# Patient Record
Sex: Male | Born: 2000
Health system: Southern US, Community
[De-identification: ages and names within clinical notes are randomized; demographics above are authoritative.]

## PROBLEM LIST (undated history)

## (undated) DIAGNOSIS — G43909 Migraine, unspecified, not intractable, without status migrainosus: Secondary | ICD-10-CM

---

## 2019-06-24 ENCOUNTER — Other Ambulatory Visit: Payer: Self-pay | Admitting: *Deleted

## 2019-06-24 DIAGNOSIS — Z20822 Contact with and (suspected) exposure to covid-19: Secondary | ICD-10-CM

## 2019-06-29 LAB — NOVEL CORONAVIRUS, NAA: SARS-CoV-2, NAA: NOT DETECTED

## 2020-06-11 ENCOUNTER — Emergency Department
Admission: EM | Admit: 2020-06-11 | Discharge: 2020-06-11 | Disposition: A | Discharge status: Home / Self Care | Payer: Medicaid Other | Attending: Emergency Medicine | Admitting: Emergency Medicine

## 2020-06-11 ENCOUNTER — Other Ambulatory Visit: Payer: Self-pay

## 2020-06-11 ENCOUNTER — Emergency Department: Payer: Medicaid Other

## 2020-06-11 DIAGNOSIS — B349 Viral infection, unspecified: Secondary | ICD-10-CM | POA: Insufficient documentation

## 2020-06-11 DIAGNOSIS — R109 Unspecified abdominal pain: Secondary | ICD-10-CM | POA: Diagnosis present

## 2020-06-11 DIAGNOSIS — K529 Noninfective gastroenteritis and colitis, unspecified: Secondary | ICD-10-CM | POA: Diagnosis not present

## 2020-06-11 LAB — COMPREHENSIVE METABOLIC PANEL
ALT: 39 U/L (ref 0–44)
AST: 87 U/L — ABNORMAL HIGH (ref 15–41)
Albumin: 4 g/dL (ref 3.5–5.0)
Alkaline Phosphatase: 26 U/L — ABNORMAL LOW (ref 38–126)
Anion gap: 11 (ref 5–15)
BUN: 25 mg/dL — ABNORMAL HIGH (ref 6–20)
CO2: 24 mmol/L (ref 22–32)
Calcium: 8.5 mg/dL — ABNORMAL LOW (ref 8.9–10.3)
Chloride: 99 mmol/L (ref 98–111)
Creatinine, Ser: 1.17 mg/dL (ref 0.61–1.24)
GFR calc Af Amer: >60 mL/min (ref >60)
GFR calc non Af Amer: >60 mL/min (ref >60)
Glucose, Bld: 130 mg/dL — ABNORMAL HIGH (ref 70–99)
Potassium: 4 mmol/L (ref 3.5–5.1)
Sodium: 134 mmol/L — ABNORMAL LOW (ref 135–145)
Total Bilirubin: 0.8 mg/dL (ref 0.3–1.2)
Total Protein: 6.7 g/dL (ref 6.5–8.1)

## 2020-06-11 LAB — CBC WITH DIFFERENTIAL/PLATELET
Abs Immature Granulocytes: 0.01 10*3/uL (ref 0.00–0.07)
Basophils Absolute: 0 10*3/uL (ref 0.0–0.1)
Basophils Relative: 0 %
Eosinophils Absolute: 0 10*3/uL (ref 0.0–0.5)
Eosinophils Relative: 0 %
HCT: 47.6 % (ref 39.0–52.0)
Hemoglobin: 16.6 g/dL (ref 13.0–17.0)
Immature Granulocytes: 0 %
Lymphocytes Relative: 39 %
Lymphs Abs: 1.2 10*3/uL (ref 0.7–4.0)
MCH: 30.8 pg (ref 26.0–34.0)
MCHC: 34.9 g/dL (ref 30.0–36.0)
MCV: 88.3 fL (ref 80.0–100.0)
Monocytes Absolute: 0.1 10*3/uL (ref 0.1–1.0)
Monocytes Relative: 4 %
Neutro Abs: 1.7 10*3/uL (ref 1.7–7.7)
Neutrophils Relative %: 57 %
Platelets: 64 10*3/uL — ABNORMAL LOW (ref 150–400)
RBC: 5.39 MIL/uL (ref 4.22–5.81)
RDW: 12.5 % (ref 11.5–15.5)
WBC: 3 10*3/uL — ABNORMAL LOW (ref 4.0–10.5)
nRBC: 0 % (ref 0.0–0.2)

## 2020-06-11 LAB — CBC
HCT: 47.8 % (ref 39.0–52.0)
Hemoglobin: 16.6 g/dL (ref 13.0–17.0)
MCH: 30.7 pg (ref 26.0–34.0)
MCHC: 34.7 g/dL (ref 30.0–36.0)
MCV: 88.4 fL (ref 80.0–100.0)
Platelets: 65 10*3/uL — ABNORMAL LOW (ref 150–400)
RBC: 5.41 MIL/uL (ref 4.22–5.81)
RDW: 12.4 % (ref 11.5–15.5)
WBC: 3 10*3/uL — ABNORMAL LOW (ref 4.0–10.5)
nRBC: 0 % (ref 0.0–0.2)

## 2020-06-11 LAB — LIPASE, BLOOD: Lipase: 252 U/L — ABNORMAL HIGH (ref 11–51)

## 2020-06-11 MED ORDER — IOHEXOL 300 MG/ML  SOLN
75.0000 mL | Freq: Once | INTRAMUSCULAR | Status: AC | PRN
  Administered 2020-06-11: 75 mL via INTRAVENOUS

## 2020-06-11 MED ORDER — ONDANSETRON HCL 4 MG/2ML IJ SOLN
4.0000 mg | Freq: Once | INTRAMUSCULAR | Status: AC
  Administered 2020-06-11: 4 mg via INTRAVENOUS
  Filled 2020-06-11: qty 2

## 2020-06-11 MED ORDER — SODIUM CHLORIDE 0.9% FLUSH: 3.0000 mL | Freq: Once | INTRAVENOUS | Status: DC

## 2020-06-11 MED ORDER — FAMOTIDINE 20 MG PO TABS
20.0000 mg | ORAL_TABLET | Freq: Two times a day (BID) | ORAL | 0 refills | Status: DC
Start: 2020-06-11 — End: 2020-06-21

## 2020-06-11 MED ORDER — ALUM & MAG HYDROXIDE-SIMETH 200-200-20 MG/5ML PO SUSP
30.0000 mL | Freq: Once | ORAL | Status: AC
  Administered 2020-06-11: 30 mL via ORAL
  Filled 2020-06-11: qty 30

## 2020-06-11 MED ORDER — FAMOTIDINE 20 MG PO TABS
40.0000 mg | ORAL_TABLET | Freq: Once | ORAL | Status: AC
  Administered 2020-06-11: 40 mg via ORAL
  Filled 2020-06-11: qty 2

## 2020-06-11 MED ORDER — SODIUM CHLORIDE 0.9 % IV BOLUS
1000.0000 mL | Freq: Once | INTRAVENOUS | Status: AC
  Administered 2020-06-11: 1000 mL via INTRAVENOUS

## 2020-06-11 MED ORDER — KETOROLAC TROMETHAMINE 30 MG/ML IJ SOLN
15.0000 mg | INTRAMUSCULAR | Status: AC
  Administered 2020-06-11: 15 mg via INTRAVENOUS
  Filled 2020-06-11: qty 1

## 2020-06-11 MED ORDER — ONDANSETRON 4 MG PO TBDP
4.0000 mg | ORAL_TABLET | Freq: Three times a day (TID) | ORAL | 0 refills | Status: DC | PRN
Start: 2020-06-11 — End: 2020-06-21

## 2020-06-11 MED ORDER — ALUMINUM-MAGNESIUM-SIMETHICONE 200-200-20 MG/5ML PO SUSP
30.0000 mL | Freq: Three times a day (TID) | ORAL | 0 refills | Status: DC
Start: 2020-06-11 — End: 2020-06-21

## 2020-06-11 NOTE — ED Notes (Signed)
No report received- RN in cath lab. Assumed care at this time.

## 2020-06-11 NOTE — ED Notes (Signed)
Warm compress applied to left AC. 

## 2020-06-11 NOTE — ED Notes (Signed)
Pt passing PO/fluid challenge.

## 2020-06-11 NOTE — ED Notes (Signed)
attempted IV x2 without success. Alternate RN to try.

## 2020-06-11 NOTE — ED Provider Notes (Signed)
[facility] Emergency Department Provider Note  ____________________________________________  Time seen: Approximately 1:06 PM  I have reviewed the triage vital signs and the nursing notes.   HISTORY  Chief Complaint Abdominal Pain    HPI Juan Vaughn is a 19 y.o. male with no significant past medical history who comes the ED complaining of nausea vomiting diarrhea and generalized abdominal pain worse in the right lower quadrant that started 2 days ago, constant, waxing waning, no aggravating or alleviating factors.  Had a fever yesterday but none today.  No preceding illness.  No known Covid exposures or other sick contacts, no unprotected sex.  Had both Covid vaccines 2 months ago.  Reports a recent negative Covid test.      History reviewed. No pertinent past medical history.   There are no problems to display for this patient.    History reviewed. No pertinent surgical history.   Prior to Admission medications   Medication Sig Start Date End Date Taking? Authorizing Provider  aluminum-magnesium hydroxide-simethicone (MAALOX) 115-726-20 MG/5ML SUSP Take 30 mLs by mouth 4 (four) times daily -  before meals and at bedtime. 06/11/20   Carrie Mew, MD  famotidine (PEPCID) 20 MG tablet Take 1 tablet (20 mg total) by mouth 2 (two) times daily. 06/11/20   Carrie Mew, MD  ondansetron (ZOFRAN ODT) 4 MG disintegrating tablet Take 1 tablet (4 mg total) by mouth every 8 (eight) hours as needed for nausea or vomiting. 06/11/20   Carrie Mew, MD  None   Allergies Patient has no allergy information on record.   No family history on file.  Social History Social History   Tobacco Use  . Smoking status: Never Smoker  . Smokeless tobacco: Never Used  Substance Use Topics  . Alcohol use: Never  . Drug use: Never    Review of Systems  Constitutional:   Positive fever and chills.  ENT:   Positive sore throat. No  rhinorrhea. Cardiovascular:   No chest pain or syncope. Respiratory:   No dyspnea or cough. Gastrointestinal:   Positive as above for abdominal pain, vomiting and diarrhea.  Musculoskeletal:   Negative for focal pain or swelling All other systems reviewed and are negative except as documented above in ROS and HPI.  ____________________________________________   PHYSICAL EXAM:  VITAL SIGNS: ED Triage Vitals [06/11/20 0853]  Enc Vitals Group     BP 101/70     Pulse Rate (!) 112     Resp 19     Temp 99.6 F (37.6 C)     Temp src      SpO2 100 %     Weight 125 lb (56.7 kg)     Height 5\' 6"  (1.676 m)     Head Circumference      Peak Flow      Pain Score 4     Pain Loc      Pain Edu?      Excl. in [place]?     Vital signs reviewed, nursing assessments reviewed.   Constitutional:   Alert and oriented. Non-toxic appearance. Eyes:   Conjunctivae are normal. EOMI. PERRL. ENT      Head:   Normocephalic and atraumatic.      Nose: Normal      Mouth/Throat: Moist mucosa, no pharyngeal erythema      Neck:   No meningismus. Full ROM. Hematological/Lymphatic/Immunilogical:   No cervical lymphadenopathy. Cardiovascular:   Tachycardia heart rate 110. Symmetric bilateral radial and DP pulses.  No  murmurs. Cap refill less than 2 seconds. Respiratory:   Normal respiratory effort without tachypnea/retractions. Breath sounds are clear and equal bilaterally. No wheezes/rales/rhonchi. Gastrointestinal:   Soft with right lower quadrant tenderness.  No epigastric tenderness. Non distended. There is no CVA tenderness.  No rebound, rigidity, or guarding. Musculoskeletal:   Normal range of motion in all extremities. No joint effusions.  No lower extremity tenderness.  No edema. Neurologic:   Normal speech and language.  Motor grossly intact. No acute focal neurologic deficits are appreciated.  Skin:    Skin is warm, dry and intact. No rash noted.  No petechiae, purpura, or  bullae.  ____________________________________________    LABS (pertinent positives/negatives) (all labs ordered are listed, but only abnormal results are displayed) Labs Reviewed  LIPASE, BLOOD - Abnormal; Notable for the following components:      Result Value   Lipase 252 (*)    All other components within normal limits  COMPREHENSIVE METABOLIC PANEL - Abnormal; Notable for the following components:   Sodium 134 (*)    Glucose, Bld 130 (*)    BUN 25 (*)    Calcium 8.5 (*)    AST 87 (*)    Alkaline Phosphatase 26 (*)    All other components within normal limits  CBC - Abnormal; Notable for the following components:   WBC 3.0 (*)    Platelets 65 (*)    All other components within normal limits  CBC WITH DIFFERENTIAL/PLATELET - Abnormal; Notable for the following components:   WBC 3.0 (*)    Platelets 64 (*)    All other components within normal limits  URINALYSIS, COMPLETE (UACMP) WITH MICROSCOPIC   ____________________________________________   EKG    ____________________________________________    RADIOLOGY  CT ABDOMEN PELVIS W CONTRAST  Result Date: 06/11/2020 CLINICAL DATA:  Pt comes via POV from home with c/o N/V/D, abdominal pain. Pt states he hasn't been able to tolerate anything PO. Pt states RLQ pain. EXAM: CT ABDOMEN AND PELVIS WITH CONTRAST TECHNIQUE: Multidetector CT imaging of the abdomen and pelvis was performed using the standard protocol following bolus administration of intravenous contrast. CONTRAST:  44mL OMNIPAQUE IOHEXOL 300 MG/ML  SOLN COMPARISON:  None. FINDINGS: Lower chest: Clear lung bases.  Heart normal in size. Hepatobiliary: No focal liver abnormality is seen. No gallstones, gallbladder wall thickening, or biliary dilatation. Pancreas: Unremarkable. No pancreatic ductal dilatation or surrounding inflammatory changes. Spleen: Normal in size without focal abnormality. Adrenals/Urinary Tract: Adrenal glands are unremarkable. Kidneys are normal,  without renal calculi, focal lesion, or hydronephrosis. Bladder is unremarkable. Stomach/Bowel: On the coronal and sagittal views, the normal appendix is suggested. There are no findings to suggest acute appendicitis. Normal stomach. Small bowel and colon are normal in caliber. No wall thickening. No bowel inflammation. Vascular/Lymphatic: No significant vascular findings are present. No enlarged abdominal or pelvic lymph nodes. Reproductive: Uterus and bilateral adnexa are unremarkable. Other: No abdominal wall hernia or abnormality. No abdominopelvic ascites. Musculoskeletal: No acute or significant osseous findings. IMPRESSION: Normal enhanced CT scan of the abdomen pelvis. Electronically Signed   By: Amie Portland M.D.   On: 06/11/2020 12:17    ____________________________________________   PROCEDURES Procedures  ____________________________________________  DIFFERENTIAL DIAGNOSIS   Appendicitis, pancreatitis, viral gastroenteritis, dehydration, electrolyte normality  CLINICAL IMPRESSION / ASSESSMENT AND PLAN / ED COURSE  Medications ordered in the ED: Medications  sodium chloride flush (NS) 0.9 % injection 3 mL (3 mLs Intravenous Not Given 06/11/20 1104)  sodium chloride 0.9 % bolus 1,000  mL (0 mLs Intravenous Stopped 06/11/20 1333)  ondansetron (ZOFRAN) injection 4 mg (4 mg Intravenous Given 06/11/20 1125)  ketorolac (TORADOL) 30 MG/ML injection 15 mg (15 mg Intravenous Given 06/11/20 1124)  iohexol (OMNIPAQUE) 300 MG/ML solution 75 mL (75 mLs Intravenous Contrast Given 06/11/20 1204)  alum & mag hydroxide-simeth (MAALOX/MYLANTA) 200-200-20 MG/5ML suspension 30 mL (30 mLs Oral Given 06/11/20 1246)  famotidine (PEPCID) tablet 40 mg (40 mg Oral Given 06/11/20 1246)    Pertinent labs & imaging results that were available during my care of the patient were reviewed by me and considered in my medical decision making (see chart for details).  Nyree Yonker was evaluated in Emergency Department  on 06/11/2020 for the symptoms described in the history of present illness. He was evaluated in the context of the global COVID-19 pandemic, which necessitated consideration that the patient might be at risk for infection with the SARS-CoV-2 virus that causes COVID-19. Institutional protocols and algorithms that pertain to the evaluation of patients at risk for COVID-19 are in a state of rapid change based on information released by regulatory bodies including the CDC and federal and state organizations. These policies and algorithms were followed during the patient's care in the ED.   Patient presents with multiple symptoms suggestive of viral illness.  Does have right lower quadrant tenderness on exam so we will proceed with CT scan to evaluate for appendicitis.  IV fluids, Toradol, Zofran for pain and nausea relief and hydration.  Clinical Course as of Jun 11 1436  Sun Jun 11, 2020  1239 Patient feeling better.  Abdomen is nontender.  Will p.o. trial, plan to discharge home if tolerating oral intake with supportive care for viral syndrome.   [PS]    Clinical Course User Index [PS] Sharman Cheek, MD      ----------------------------------------- 2:37 PM on 06/11/2020 -----------------------------------------  Tolerating oral intake and feeling better, vital signs are normal.  Stable for discharge home. ____________________________________________   FINAL CLINICAL IMPRESSION(S) / ED DIAGNOSES    Final diagnoses:  Gastroenteritis  Viral syndrome     ED Discharge Orders         Ordered    famotidine (PEPCID) 20 MG tablet  2 times daily     Discontinue  Reprint     06/11/20 1435    ondansetron (ZOFRAN ODT) 4 MG disintegrating tablet  Every 8 hours PRN     Discontinue  Reprint     06/11/20 1435    aluminum-magnesium hydroxide-simethicone (MAALOX) 200-200-20 MG/5ML SUSP  3 times daily before meals & bedtime     Discontinue  Reprint     06/11/20 1435          Portions of  this note were generated with dragon dictation software. Dictation errors may occur despite best attempts at proofreading.   Sharman Cheek, MD 06/11/20 [phone]

## 2020-06-11 NOTE — ED Triage Notes (Addendum)
Pt comes via POV from home with c/o N/V/D, abdominal pain. Pt states he hasn't been able to tolerate anything PO. Pt states RLQ pain.   Pt states he was recently checked for COVID and it was negative.

## 2020-06-11 NOTE — ED Notes (Signed)
Pt still needs urine sample. Pt unable to pee at this time. Pt provided with urine cup

## 2020-06-11 NOTE — ED Notes (Signed)
Pt provided crackers and water for PO challenge

## 2020-06-11 NOTE — ED Notes (Signed)
Pt c/o of abdominal pain, nausea, and dehydration due to being unable to tolerate food or drink. Pt is AOx4, appears uncomfortable.

## 2020-06-11 NOTE — ED Notes (Signed)
Pt in CT.

## 2020-06-17 ENCOUNTER — Inpatient Hospital Stay
Admission: EM | Admit: 2020-06-17 | Discharge: 2020-06-21 | DRG: 871 | Disposition: A | Discharge status: Home / Self Care | Payer: Medicaid Other | Attending: Family Medicine | Admitting: Family Medicine

## 2020-06-17 ENCOUNTER — Other Ambulatory Visit: Payer: Self-pay

## 2020-06-17 ENCOUNTER — Emergency Department: Payer: Medicaid Other

## 2020-06-17 ENCOUNTER — Encounter: Payer: Self-pay | Admitting: Emergency Medicine

## 2020-06-17 DIAGNOSIS — G030 Nonpyogenic meningitis: Secondary | ICD-10-CM

## 2020-06-17 DIAGNOSIS — J189 Pneumonia, unspecified organism: Secondary | ICD-10-CM | POA: Diagnosis present

## 2020-06-17 DIAGNOSIS — Z20822 Contact with and (suspected) exposure to covid-19: Secondary | ICD-10-CM | POA: Diagnosis present

## 2020-06-17 DIAGNOSIS — A419 Sepsis, unspecified organism: Principal | ICD-10-CM | POA: Diagnosis present

## 2020-06-17 DIAGNOSIS — E43 Unspecified severe protein-calorie malnutrition: Secondary | ICD-10-CM | POA: Diagnosis present

## 2020-06-17 DIAGNOSIS — B20 Human immunodeficiency virus [HIV] disease: Secondary | ICD-10-CM

## 2020-06-17 DIAGNOSIS — Z681 Body mass index (BMI) 19 or less, adult: Secondary | ICD-10-CM

## 2020-06-17 DIAGNOSIS — Z21 Asymptomatic human immunodeficiency virus [HIV] infection status: Secondary | ICD-10-CM | POA: Diagnosis present

## 2020-06-17 DIAGNOSIS — G43909 Migraine, unspecified, not intractable, without status migrainosus: Secondary | ICD-10-CM | POA: Diagnosis present

## 2020-06-17 DIAGNOSIS — B159 Hepatitis A without hepatic coma: Secondary | ICD-10-CM | POA: Diagnosis present

## 2020-06-17 DIAGNOSIS — A879 Viral meningitis, unspecified: Secondary | ICD-10-CM | POA: Diagnosis present

## 2020-06-17 HISTORY — DX: Migraine, unspecified, not intractable, without status migrainosus: G43.909

## 2020-06-17 LAB — BASIC METABOLIC PANEL
Anion gap: 12 (ref 5–15)
BUN: 17 mg/dL (ref 6–20)
CO2: 25 mmol/L (ref 22–32)
Calcium: 8.9 mg/dL (ref 8.9–10.3)
Chloride: 100 mmol/L (ref 98–111)
Creatinine, Ser: 1.21 mg/dL (ref 0.61–1.24)
GFR calc Af Amer: >60 mL/min (ref >60)
GFR calc non Af Amer: >60 mL/min (ref >60)
Glucose, Bld: 100 mg/dL — ABNORMAL HIGH (ref 70–99)
Potassium: 4.3 mmol/L (ref 3.5–5.1)
Sodium: 137 mmol/L (ref 135–145)

## 2020-06-17 LAB — CBC
HCT: 44.1 % (ref 39.0–52.0)
Hemoglobin: 15.2 g/dL (ref 13.0–17.0)
MCH: 30.5 pg (ref 26.0–34.0)
MCHC: 34.5 g/dL (ref 30.0–36.0)
MCV: 88.4 fL (ref 80.0–100.0)
Platelets: 286 10*3/uL (ref 150–400)
RBC: 4.99 MIL/uL (ref 4.22–5.81)
RDW: 11.9 % (ref 11.5–15.5)
WBC: 6.2 10*3/uL (ref 4.0–10.5)
nRBC: 0 % (ref 0.0–0.2)

## 2020-06-17 LAB — RAPID HIV SCREEN (HIV 1/2 AB+AG)
HIV 1/2 Antibodies: NONREACTIVE
HIV-1 P24 Antigen - HIV24: REACTIVE — AB

## 2020-06-17 LAB — URINALYSIS, COMPLETE (UACMP) WITH MICROSCOPIC
Bacteria, UA: NONE SEEN
Bilirubin Urine: NEGATIVE
Glucose, UA: NEGATIVE mg/dL
Hgb urine dipstick: NEGATIVE
Ketones, ur: 20 mg/dL — AB
Nitrite: NEGATIVE
Protein, ur: 30 mg/dL — AB
Specific Gravity, Urine: 1.031 — ABNORMAL HIGH (ref 1.005–1.030)
Squamous Epithelial / HPF: NONE SEEN (ref 0–5)
pH: 7 (ref 5.0–8.0)

## 2020-06-17 LAB — GLUCOSE, CAPILLARY: Glucose-Capillary: 90 mg/dL (ref 70–99)

## 2020-06-17 LAB — GROUP A STREP BY PCR: Group A Strep by PCR: NOT DETECTED

## 2020-06-17 LAB — SARS CORONAVIRUS 2 BY RT PCR (HOSPITAL ORDER, PERFORMED IN ~~LOC~~ HOSPITAL LAB): SARS Coronavirus 2: NEGATIVE

## 2020-06-17 LAB — LACTIC ACID, PLASMA: Lactic Acid, Venous: 2.1 mmol/L (ref 0.5–1.9)

## 2020-06-17 MED ORDER — ACETAMINOPHEN 500 MG PO TABS
ORAL_TABLET | ORAL | Status: AC
  Administered 2020-06-17: 1000 mg
  Filled 2020-06-17: qty 2

## 2020-06-17 MED ORDER — METOCLOPRAMIDE HCL 10 MG PO TABS
10.0000 mg | ORAL_TABLET | Freq: Once | ORAL | Status: AC
  Administered 2020-06-17: 10 mg via ORAL
  Filled 2020-06-17: qty 1

## 2020-06-17 MED ORDER — KETOROLAC TROMETHAMINE 30 MG/ML IJ SOLN
30.0000 mg | Freq: Once | INTRAMUSCULAR | Status: AC
  Administered 2020-06-17: 30 mg via INTRAMUSCULAR
  Filled 2020-06-17: qty 1

## 2020-06-17 MED ORDER — DIPHENHYDRAMINE HCL 50 MG/ML IJ SOLN
25.0000 mg | Freq: Once | INTRAMUSCULAR | Status: AC
  Administered 2020-06-17: 25 mg via INTRAVENOUS
  Filled 2020-06-17: qty 1

## 2020-06-17 MED ORDER — SODIUM CHLORIDE 0.9 % IV BOLUS
1000.0000 mL | Freq: Once | INTRAVENOUS | Status: AC
  Administered 2020-06-17: 1000 mL via INTRAVENOUS

## 2020-06-17 NOTE — ED Notes (Signed)
Patient given a warm blanket and given update on wait time. Patient verbalizes understanding.

## 2020-06-17 NOTE — ED Notes (Signed)
Patient given a warm blanket. 

## 2020-06-17 NOTE — ED Notes (Signed)
Patient c/o mild photosensitivity and has chills.

## 2020-06-17 NOTE — ED Notes (Signed)
Pt requesting HIV testing at this time. Pt denies any new known exposure but reports "just wanting to make sure". PA Annice Pih, notified

## 2020-06-17 NOTE — ED Notes (Addendum)
PA Annice Pih notified of lab results-   Lab tech informed this RN of reactive HIV test and a lactic result of 2.1.  No new orders at this time

## 2020-06-17 NOTE — ED Provider Notes (Signed)
Emergency Department Provider Note  ____________________________________________  Time seen: Approximately 9:52 PM  I have reviewed the triage vital signs and the nursing notes.   HISTORY  Chief Complaint Migraine and Nausea   Historian Patient     HPI Juan Vaughn is a 19 y.o. male with a history of migraines presents to the emergency department with fever, headache, chills and malaise for the past 2 to 3 days.  Patient states that he has a history of migraines and his current headache feels similar. He denies pain at the neck and has full range of motion.  He denies pharyngitis.  He states that he has had some nausea since being diagnosed with gastroenteritis on 06/11/2020.Marland Kitchen  No chest pain, chest tightness or abdominal pain.  Patient states that he occasionally feels dizzy when he stands up.  He states that he would like to be tested for HIV when questioned about whether he has had recent unprotected sex.  He denies cough or recent travel.  No recent weight loss or weight gain.  No night sweats or chills.   Past Medical History:  Diagnosis Date  . Migraine      Immunizations up to date:  Yes.     Past Medical History:  Diagnosis Date  . Migraine     There are no problems to display for this patient.   History reviewed. No pertinent surgical history.  Prior to Admission medications   Medication Sig Start Date End Date Taking? Authorizing Provider  aluminum-magnesium hydroxide-simethicone (MAALOX) 200-200-20 MG/5ML SUSP Take 30 mLs by mouth 4 (four) times daily -  before meals and at bedtime. 06/11/20   Sharman Cheek, MD  famotidine (PEPCID) 20 MG tablet Take 1 tablet (20 mg total) by mouth 2 (two) times daily. 06/11/20   Sharman Cheek, MD  ondansetron (ZOFRAN ODT) 4 MG disintegrating tablet Take 1 tablet (4 mg total) by mouth every 8 (eight) hours as needed for nausea or vomiting. 06/11/20   Sharman Cheek, MD    Allergies Patient has no known  allergies.  No family history on file.  Social History Social History   Tobacco Use  . Smoking status: Never Smoker  . Smokeless tobacco: Never Used  Vaping Use  . Vaping Use: Never used  Substance Use Topics  . Alcohol use: Never  . Drug use: Never     Review of Systems  Constitutional: Patient has fever.  Eyes:  No discharge ENT: No upper respiratory complaints. Respiratory: no cough. No SOB/ use of accessory muscles to breath Gastrointestinal:   No nausea, no vomiting.  No diarrhea.  No constipation. Musculoskeletal: Negative for musculoskeletal pain. Neuro: Patient has headache.  Skin: Negative for rash, abrasions, lacerations, ecchymosis.    ____________________________________________   PHYSICAL EXAM:  VITAL SIGNS: ED Triage Vitals  Enc Vitals Group     BP 06/17/20 1913 100/63     Pulse Rate 06/17/20 1913 (!) 114     Resp 06/17/20 1913 18     Temp 06/17/20 1913 98.9 F (37.2 C)     Temp Source 06/17/20 1913 Oral     SpO2 06/17/20 1913 100 %     Weight 06/17/20 1913 107 lb (48.5 kg)     Height 06/17/20 1913 5\' 6"  (1.676 m)     Head Circumference --      Peak Flow --      Pain Score 06/17/20 1912 10     Pain Loc --      Pain Edu? --  Excl. in [place]? --      Constitutional: Alert and oriented. Patient is lying supine. Eyes: Conjunctivae are normal. PERRL. EOMI. Head: Atraumatic. ENT:      Ears: Tympanic membranes are mildly injected with mild effusion bilaterally.       Nose: No congestion/rhinnorhea.      Mouth/Throat: Mucous membranes are moist. Posterior pharynx is mildly erythematous.  Hematological/Lymphatic/Immunilogical: No cervical lymphadenopathy.  Cardiovascular: Normal rate, regular rhythm. Normal S1 and S2.  Good peripheral circulation. Respiratory: Normal respiratory effort without tachypnea or retractions. Lungs CTAB. Good air entry to the bases with no decreased or absent breath sounds. Gastrointestinal: Bowel sounds 4 quadrants.  Soft and nontender to palpation. No guarding or rigidity. No palpable masses. No distention. No CVA tenderness. Musculoskeletal: Full range of motion to all extremities. No gross deformities appreciated. Neurologic:  Normal speech and language. No gross focal neurologic deficits are appreciated.  Skin:  Skin is warm, dry and intact. No rash noted. Psychiatric: Mood and affect are normal. Speech and behavior are normal. Patient exhibits appropriate insight and judgement.   ____________________________________________   LABS (all labs ordered are listed, but only abnormal results are displayed)  Labs Reviewed  BASIC METABOLIC PANEL - Abnormal; Notable for the following components:      Result Value   Glucose, Bld 100 (*)    All other components within normal limits  URINALYSIS, COMPLETE (UACMP) WITH MICROSCOPIC - Abnormal; Notable for the following components:   Color, Urine YELLOW (*)    APPearance HAZY (*)    Specific Gravity, Urine 1.031 (*)    Ketones, ur 20 (*)    Protein, ur 30 (*)    Leukocytes,Ua TRACE (*)    All other components within normal limits  LACTIC ACID, PLASMA - Abnormal; Notable for the following components:   Lactic Acid, Venous 2.1 (*)    All other components within normal limits  RAPID HIV SCREEN (HIV 1/2 AB+AG) - Abnormal; Notable for the following components:   HIV-1 P24 Antigen - HIV24 Reactive (*)    All other components within normal limits  GROUP A STREP BY PCR  SARS CORONAVIRUS 2 BY RT PCR (HOSPITAL ORDER, PERFORMED IN [facility])  URINE CULTURE  CULTURE, BLOOD (ROUTINE X 2)  CULTURE, BLOOD (ROUTINE X 2)  URINE CULTURE  CBC  GLUCOSE, CAPILLARY  LACTIC ACID, PLASMA  HIV-1/2 AB - DIFFERENTIATION  CBG MONITORING, ED   ____________________________________________  EKG   ____________________________________________  RADIOLOGY Geraldo Pitter, personally viewed and evaluated these images (plain radiographs) as part of my medical  decision making, as well as reviewing the written report by the radiologist.    DG Chest 1 View  Result Date: 06/17/2020 CLINICAL DATA:  19 year old male with fever. EXAM: CHEST  1 VIEW COMPARISON:  None. FINDINGS: The heart size and mediastinal contours are within normal limits. Both lungs are clear. The visualized skeletal structures are unremarkable. IMPRESSION: No active disease. Electronically Signed   By: Elgie Collard M.D.   On: 06/17/2020 22:13    ____________________________________________    PROCEDURES  Procedure(s) performed:     Procedures     Medications  ketorolac (TORADOL) 30 MG/ML injection 30 mg (30 mg Intramuscular Given 06/17/20 2156)  metoCLOPramide (REGLAN) tablet 10 mg (10 mg Oral Given 06/17/20 2156)  diphenhydrAMINE (BENADRYL) injection 25 mg (25 mg Intravenous Given 06/17/20 2156)  sodium chloride 0.9 % bolus 1,000 mL (1,000 mLs Intravenous New Bag/Given 06/17/20 2234)  acetaminophen (TYLENOL) 500 MG tablet (  1,000 mg  Given 06/17/20 2234)     ____________________________________________   INITIAL IMPRESSION / ASSESSMENT AND PLAN / ED COURSE  Pertinent labs & imaging results that were available during my care of the patient were reviewed by me and considered in my medical decision making (see chart for details).      Assessment and plan:  Sepsis 19 year old male presents to the emergency department with fever, headache and dizziness for the past 2 to 3 days.  Patient was tachycardic at triage but was initially afebrile.  Patient was transitioned to flex for migraine management as patient stated that he had a history of migraines in triage.  Nursing staff noted that patient seemed chilled and reassessed vital signs.  Patient was febrile, tachycardic and hypotensive upon reassessment.  During history, I questioned patient if he had had recent unprotected sex.  He stated that he had not but did request a HIV test.  HIV 1P 24 was reactive.  I added on  lactic acid and blood cultures due to concern for sepsis.  Lactic acid was elevated.  Blood cultures are in process at this time.  Group A strep testing was negative.  I did note that patient's white blood cell count had increased from 3 to 6.2 over the past 6 days which is concerning for leukocytosis in the setting of HIV.  I had an extensive conversation with my attending, Dr. Roxan Hockey regarding patient's history and presenting symptoms in the emergency department.  Patient was transitioned to the main side of the emergency department for further care and management. CT head without contrast was ordered per Dr. Danella Penton recommendation. I will hold off on administering IV antibiotics until a decision can be made regarding whether patient will need a lumbar puncture.   ____________________________________________  FINAL CLINICAL IMPRESSION(S) / ED DIAGNOSES  Final diagnoses:  Symptomatic HIV infection (HCC)  Sepsis, due to unspecified organism, unspecified whether acute organ dysfunction present (HCC)      NEW MEDICATIONS STARTED DURING THIS VISIT:  ED Discharge Orders    None          This chart was dictated using voice recognition software/Dragon. Despite best efforts to proofread, errors can occur which can change the meaning. Any change was purely unintentional.     Orvil Feil, PA-C 06/18/20 0007    Willy Eddy, MD 06/18/20 [phone]

## 2020-06-17 NOTE — ED Notes (Signed)
Leonides Schanz PA-C aware of new vital signs.

## 2020-06-17 NOTE — ED Notes (Signed)
First Nurse Note: Pt to ED for HA, abd pain, and nausea. Pt is in NAD.

## 2020-06-17 NOTE — ED Triage Notes (Signed)
Pt arrived via POV with c/o headache, dizziness.  Pt seen here on 6/27 dx with gastroenteritis.  Pt states he is nauseated with HA. Pt states he did not take any nausea medications today.  Pt states he has been taking nausea meds daily.  Pt reports hx of migraines but states he thought he grew out of them.  Pt reports photosensitivity with the headache.  Pt states he hasn't felt like this in many years.

## 2020-06-18 ENCOUNTER — Encounter: Payer: Self-pay | Admitting: Family Medicine

## 2020-06-18 ENCOUNTER — Emergency Department: Payer: Medicaid Other

## 2020-06-18 DIAGNOSIS — Z681 Body mass index (BMI) 19 or less, adult: Secondary | ICD-10-CM | POA: Diagnosis not present

## 2020-06-18 DIAGNOSIS — B20 Human immunodeficiency virus [HIV] disease: Secondary | ICD-10-CM | POA: Diagnosis not present

## 2020-06-18 DIAGNOSIS — G039 Meningitis, unspecified: Secondary | ICD-10-CM | POA: Diagnosis not present

## 2020-06-18 DIAGNOSIS — G030 Nonpyogenic meningitis: Secondary | ICD-10-CM | POA: Diagnosis not present

## 2020-06-18 DIAGNOSIS — B159 Hepatitis A without hepatic coma: Secondary | ICD-10-CM | POA: Diagnosis present

## 2020-06-18 DIAGNOSIS — Z21 Asymptomatic human immunodeficiency virus [HIV] infection status: Secondary | ICD-10-CM | POA: Diagnosis present

## 2020-06-18 DIAGNOSIS — N390 Urinary tract infection, site not specified: Secondary | ICD-10-CM

## 2020-06-18 DIAGNOSIS — E43 Unspecified severe protein-calorie malnutrition: Secondary | ICD-10-CM | POA: Diagnosis present

## 2020-06-18 DIAGNOSIS — A419 Sepsis, unspecified organism: Secondary | ICD-10-CM | POA: Diagnosis present

## 2020-06-18 DIAGNOSIS — G43909 Migraine, unspecified, not intractable, without status migrainosus: Secondary | ICD-10-CM | POA: Diagnosis present

## 2020-06-18 DIAGNOSIS — R7401 Elevation of levels of liver transaminase levels: Secondary | ICD-10-CM | POA: Diagnosis not present

## 2020-06-18 DIAGNOSIS — A879 Viral meningitis, unspecified: Secondary | ICD-10-CM | POA: Diagnosis present

## 2020-06-18 DIAGNOSIS — J189 Pneumonia, unspecified organism: Secondary | ICD-10-CM | POA: Diagnosis present

## 2020-06-18 DIAGNOSIS — Z20822 Contact with and (suspected) exposure to covid-19: Secondary | ICD-10-CM | POA: Diagnosis present

## 2020-06-18 HISTORY — DX: Human immunodeficiency virus (HIV) disease: B20

## 2020-06-18 LAB — BASIC METABOLIC PANEL
Anion gap: 9 (ref 5–15)
BUN: 11 mg/dL (ref 6–20)
CO2: 26 mmol/L (ref 22–32)
Calcium: 8.3 mg/dL — ABNORMAL LOW (ref 8.9–10.3)
Chloride: 101 mmol/L (ref 98–111)
Creatinine, Ser: 1.05 mg/dL (ref 0.61–1.24)
GFR calc Af Amer: >60 mL/min (ref >60)
GFR calc non Af Amer: >60 mL/min (ref >60)
Glucose, Bld: 102 mg/dL — ABNORMAL HIGH (ref 70–99)
Potassium: 4 mmol/L (ref 3.5–5.1)
Sodium: 136 mmol/L (ref 135–145)

## 2020-06-18 LAB — CSF CELL COUNT WITH DIFFERENTIAL
Eosinophils, CSF: 0 %
Eosinophils, CSF: 0 %
Lymphs, CSF: 100 %
Lymphs, CSF: 84 %
Monocyte-Macrophage-Spinal Fluid: 0 %
Monocyte-Macrophage-Spinal Fluid: 15 %
RBC Count, CSF: 0 /mm3 (ref 0–3)
RBC Count, CSF: 0 /mm3 (ref 0–3)
Segmented Neutrophils-CSF: 0 %
Segmented Neutrophils-CSF: 1 %
Tube #: 3
WBC, CSF: 12 /mm3 (ref 0–5)
WBC, CSF: 59 /mm3 (ref 0–5)

## 2020-06-18 LAB — CBC
HCT: 44.1 % (ref 39.0–52.0)
Hemoglobin: 14.9 g/dL (ref 13.0–17.0)
MCH: 30.8 pg (ref 26.0–34.0)
MCHC: 33.8 g/dL (ref 30.0–36.0)
MCV: 91.1 fL (ref 80.0–100.0)
Platelets: 281 10*3/uL (ref 150–400)
RBC: 4.84 MIL/uL (ref 4.22–5.81)
RDW: 11.9 % (ref 11.5–15.5)
WBC: 5.2 10*3/uL (ref 4.0–10.5)
nRBC: 0 % (ref 0.0–0.2)

## 2020-06-18 LAB — CHLAMYDIA/NGC RT PCR (ARMC ONLY)
Chlamydia Tr: NOT DETECTED
N gonorrhoeae: NOT DETECTED

## 2020-06-18 LAB — PROTEIN, CSF: Total  Protein, CSF: 31 mg/dL (ref 15–45)

## 2020-06-18 LAB — GLUCOSE, CSF: Glucose, CSF: 50 mg/dL (ref 40–70)

## 2020-06-18 LAB — HIV ANTIBODY (ROUTINE TESTING W REFLEX): HIV Screen 4th Generation wRfx: REACTIVE — AB

## 2020-06-18 LAB — CRYPTOCOCCAL ANTIGEN, CSF: Crypto Ag: NEGATIVE

## 2020-06-18 MED ORDER — ONDANSETRON HCL 4 MG/2ML IJ SOLN
4.0000 mg | Freq: Four times a day (QID) | INTRAMUSCULAR | Status: DC | PRN
  Administered 2020-06-18: 4 mg via INTRAVENOUS
  Filled 2020-06-18: qty 2

## 2020-06-18 MED ORDER — SODIUM CHLORIDE 0.9 % IV SOLN
2.0000 g | Freq: Once | INTRAVENOUS | Status: AC
  Administered 2020-06-18: 2 g via INTRAVENOUS
  Filled 2020-06-18: qty 2

## 2020-06-18 MED ORDER — TRAZODONE HCL 50 MG PO TABS
25.0000 mg | ORAL_TABLET | Freq: Every evening | ORAL | Status: DC | PRN
  Filled 2020-06-18: qty 1

## 2020-06-18 MED ORDER — SODIUM CHLORIDE 0.9 % IV SOLN
INTRAVENOUS | Status: DC | PRN
  Administered 2020-06-18: 250 mL via INTRAVENOUS

## 2020-06-18 MED ORDER — BICTEGRAVIR-EMTRICITAB-TENOFOV 50-200-25 MG PO TABS
1.0000 | ORAL_TABLET | Freq: Every day | ORAL | Status: DC
  Administered 2020-06-18 – 2020-06-21 (×4): 1 via ORAL
  Filled 2020-06-18 (×4): qty 1

## 2020-06-18 MED ORDER — LACTATED RINGERS IV BOLUS (SEPSIS)
1000.0000 mL | Freq: Once | INTRAVENOUS | Status: AC
  Administered 2020-06-18: 1000 mL via INTRAVENOUS

## 2020-06-18 MED ORDER — ALUM & MAG HYDROXIDE-SIMETH 200-200-20 MG/5ML PO SUSP
30.0000 mL | Freq: Three times a day (TID) | ORAL | Status: DC
  Administered 2020-06-18 – 2020-06-21 (×14): 30 mL via ORAL
  Filled 2020-06-18 (×14): qty 30

## 2020-06-18 MED ORDER — ONDANSETRON 4 MG PO TBDP: 4.0000 mg | ORAL_TABLET | Freq: Three times a day (TID) | ORAL | Status: DC | PRN

## 2020-06-18 MED ORDER — VANCOMYCIN HCL IN DEXTROSE 1-5 GM/200ML-% IV SOLN
1000.0000 mg | Freq: Once | INTRAVENOUS | Status: AC
  Administered 2020-06-18: 1000 mg via INTRAVENOUS
  Filled 2020-06-18: qty 200

## 2020-06-18 MED ORDER — ACETAMINOPHEN 325 MG PO TABS
650.0000 mg | ORAL_TABLET | Freq: Four times a day (QID) | ORAL | Status: DC | PRN
  Administered 2020-06-18 – 2020-06-20 (×7): 650 mg via ORAL
  Filled 2020-06-18 (×7): qty 2

## 2020-06-18 MED ORDER — LORAZEPAM 2 MG/ML IJ SOLN
0.5000 mg | Freq: Once | INTRAMUSCULAR | Status: AC
  Administered 2020-06-18: 0.5 mg via INTRAVENOUS
  Filled 2020-06-18: qty 1

## 2020-06-18 MED ORDER — MAGNESIUM HYDROXIDE 400 MG/5ML PO SUSP: 30.0000 mL | Freq: Every day | ORAL | Status: DC | PRN

## 2020-06-18 MED ORDER — MORPHINE SULFATE (PF) 2 MG/ML IV SOLN: 2.0000 mg | INTRAVENOUS | Status: DC | PRN

## 2020-06-18 MED ORDER — METRONIDAZOLE IN NACL 5-0.79 MG/ML-% IV SOLN
500.0000 mg | Freq: Once | INTRAVENOUS | Status: AC
  Administered 2020-06-18: 500 mg via INTRAVENOUS
  Filled 2020-06-18: qty 100

## 2020-06-18 MED ORDER — ENOXAPARIN SODIUM 40 MG/0.4ML ~~LOC~~ SOLN
40.0000 mg | SUBCUTANEOUS | Status: DC
  Administered 2020-06-18 – 2020-06-21 (×4): 40 mg via SUBCUTANEOUS
  Filled 2020-06-18 (×4): qty 0.4

## 2020-06-18 MED ORDER — ONDANSETRON HCL 4 MG PO TABS: 4.0000 mg | ORAL_TABLET | Freq: Four times a day (QID) | ORAL | Status: DC | PRN

## 2020-06-18 MED ORDER — LACTATED RINGERS IV BOLUS (SEPSIS)
500.0000 mL | Freq: Once | INTRAVENOUS | Status: AC
  Administered 2020-06-18: 500 mL via INTRAVENOUS

## 2020-06-18 MED ORDER — SODIUM CHLORIDE 0.9 % IV SOLN
2.0000 g | Freq: Two times a day (BID) | INTRAVENOUS | Status: DC
  Administered 2020-06-18: 2 g via INTRAVENOUS
  Filled 2020-06-18 (×2): qty 20

## 2020-06-18 MED ORDER — SODIUM CHLORIDE 0.9 % IV BOLUS
1000.0000 mL | Freq: Once | INTRAVENOUS | Status: AC
  Administered 2020-06-18: 1000 mL via INTRAVENOUS

## 2020-06-18 MED ORDER — DEXTROSE 5 % IV SOLN
10.0000 mg/kg | Freq: Three times a day (TID) | INTRAVENOUS | Status: DC
  Administered 2020-06-18 – 2020-06-19 (×5): 500 mg via INTRAVENOUS
  Filled 2020-06-18 (×6): qty 10

## 2020-06-18 MED ORDER — SODIUM CHLORIDE 0.9 % IV SOLN: INTRAVENOUS | Status: DC

## 2020-06-18 MED ORDER — IBUPROFEN 400 MG PO TABS
400.0000 mg | ORAL_TABLET | ORAL | Status: DC | PRN
  Administered 2020-06-18 – 2020-06-19 (×5): 400 mg via ORAL
  Filled 2020-06-18 (×5): qty 1

## 2020-06-18 MED ORDER — ACETAMINOPHEN 650 MG RE SUPP: 650.0000 mg | Freq: Four times a day (QID) | RECTAL | Status: DC | PRN

## 2020-06-18 MED ORDER — FAMOTIDINE 20 MG PO TABS
20.0000 mg | ORAL_TABLET | Freq: Two times a day (BID) | ORAL | Status: DC
  Administered 2020-06-18 – 2020-06-21 (×7): 20 mg via ORAL
  Filled 2020-06-18 (×7): qty 1

## 2020-06-18 MED ORDER — LORAZEPAM 2 MG/ML IJ SOLN
INTRAMUSCULAR | Status: AC
  Filled 2020-06-18: qty 1

## 2020-06-18 NOTE — ED Provider Notes (Signed)
.  Critical Care Performed by: Willy Eddy, MD Authorized by: Willy Eddy, MD   Critical care provider statement:    Critical care time (minutes):  35   Critical care time was exclusive of:  Separately billable procedures and treating other patients   Critical care was necessary to treat or prevent imminent or life-threatening deterioration of the following conditions:  Sepsis   Critical care was time spent personally by me on the following activities:  Development of treatment plan with patient or surrogate, discussions with consultants, evaluation of patient's response to treatment, examination of patient, obtaining history from patient or surrogate, ordering and performing treatments and interventions, ordering and review of laboratory studies, ordering and review of radiographic studies, pulse oximetry, re-evaluation of patient's condition and review of old charts .Lumbar Puncture  Date/Time: 06/18/2020 2:19 AM Performed by: Willy Eddy, MD Authorized by: Willy Eddy, MD   Consent:    Consent obtained:  Verbal and written   Consent given by:  Patient   Risks discussed:  Headache, bleeding, infection, pain and nerve damage Pre-procedure details:    Procedure purpose:  Diagnostic   Preparation: Patient was prepped and draped in usual sterile fashion   Anesthesia (see MAR for exact dosages):    Anesthesia method:  Local infiltration   Local anesthetic:  Lidocaine 1% w/o epi Procedure details:    Lumbar space:  L3-L4 interspace   Patient position:  L lateral decubitus   Needle gauge:  22   Needle type:  Spinal needle - Quincke tip   Needle length (in):  3.5   Number of attempts:  1   Fluid appearance:  Clear   Tubes of fluid:  4 Post-procedure:    Puncture site:  Adhesive bandage applied and direct pressure applied   Patient tolerance of procedure:  Tolerated well, no immediate complications    Patient seen with PA presenting with symptoms as described in  her documentation.  Does meet such as criteria.  Patient was brought over to the main side and was initiated with broad-spectrum antibiotics as well as IV fluid resuscitation for his sepsis.  Did recommend LP and he did consent to that procedure.  Tolerated procedure well.  No report of any tick illnesses.  Also possible acute HIV causing sepsis.  Will admit to hospitalist.    Willy Eddy, MD 06/18/20 0221

## 2020-06-18 NOTE — ED Notes (Signed)
CSF collected by MD. Delphina Cahill to the lab by this RN. Patient tolerated procedure well without incident.

## 2020-06-18 NOTE — Progress Notes (Signed)
CCMD called-  Patient's HR jumped up to 180s.  Assessed patient - denies chest pain, SOB, or palpitations.     Vitals check at time- MEWs is still red BP is 96/48; T 102.9  Will hook patient up to continuous vital check   Paged MD and let charge nurse know- MD to order a bolus

## 2020-06-18 NOTE — Consult Note (Addendum)
Date of Admission:  06/17/2020          Reason for Consult: Aseptic meninigitits  and apparent acute HIV infection    Referring Provider: Dr. Chipper Herb   Assessment:  1. 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 Rule out HSV meningitis, cryptococcal meningitis, syphilitic meningitis 3. 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 Cryptococcal ag on CSF 2. VDRL on CSF 3. HSV PCR on CSF 4. OK to continue acyclovir for now 5. IF crypto ag is negative will start BIktarvy tonight 6. Check Hep serologies, send of genotype [id number], GC and chlamydia urine and oropharynx. He denies receptive anal intercourse but would also consider swab of rectum for GC. (would prefer Dr R discuss these things in person w him tomorrow) 7. We should ensure that when he is ready for DC that he leaves with a bag of 30 days of Biktarvy 8. We will make an appt with him either w Dr. Rivka Safer (who will see him tomorrow) vs [place] at [facility] 9. He will benefit from CBT and peer support  Active Problems:   Pneumonia   Scheduled Meds: . alum & mag hydroxide-simeth  30 mL Oral TID AC & HS  . enoxaparin (LOVENOX) injection  40 mg Subcutaneous Q24H  . famotidine  20 mg Oral BID   Continuous Infusions: . sodium chloride Stopped (06/18/20 1214)  . sodium chloride Stopped (06/18/20 0906)  . acyclovir Stopped (06/18/20 1141)   PRN Meds:.sodium chloride, acetaminophen **OR** acetaminophen, ibuprofen, magnesium hydroxide, morphine injection, ondansetron **OR** ondansetron (ZOFRAN) IV, traZODone  HPI: Juan Vaughn is a 19 y.o. male African American man who has had sex with men more so than women presents with acute onset of fever headache, nausea and photophobia.   He underwent LP which is c/w aseptic meningitis.  His p24 ag on HIV rapid test is positive which if true = acute retroviral syndrome.  I am awaiting CSF crypto ag to come back negative and then I will  start him on Biktarvy.  The only reason I have not already done so is in case the rapid test is somehow wrong and the patient has established HIV with an OI such as crypto. This seems exceedingly unlikely.  He certainly could have syphilis or HSV but I suspect this is all acute HIV  I am quite happy to follow him at [facility].  He is going to be going to [employer] for college in August but says he can return to this area every month if needed (that shouldn't be needed long term)  EVentually most of my patients just see me twice a year or even once a year IF they can prove that they can be highly adherent.      Review of Systems: Review of Systems  Constitutional: Positive for chills and fever. Negative for diaphoresis, malaise/fatigue and weight loss.  HENT: Negative for congestion, ear pain, hearing loss and sore throat.   Eyes: Negative for blurred vision and double vision.  Respiratory: Negative for cough, sputum production, shortness of breath, wheezing and stridor.   Cardiovascular: Negative for chest pain, palpitations and leg swelling.  Gastrointestinal: Negative for abdominal pain, blood in stool, constipation, diarrhea, heartburn, melena, nausea and vomiting.  Genitourinary: Negative for dysuria, flank pain and frequency.  Musculoskeletal: Negative for joint pain and myalgias.  Neurological: Positive for headaches. Negative for dizziness, sensory change, focal weakness, loss of consciousness and weakness.  Endo/Heme/Allergies: Does not bruise/bleed easily.  Psychiatric/Behavioral: Positive for depression. Negative for substance abuse and suicidal ideas. The patient is nervous/anxious. The patient does not have insomnia.     Past Medical History:  Diagnosis Date  . Migraine     Social History   Tobacco Use  . Smoking status: Never Smoker  . Smokeless tobacco: Never Used  Vaping Use  . Vaping Use: Never used  Substance Use Topics  . Alcohol use: Never  . Drug use:  Never    No family history on file. No Known Allergies  OBJECTIVE: Blood pressure 95/83, pulse (!) 126, temperature 99.1 F (37.3 C), temperature source Oral, resp. rate (!) 25, height 5\' 6"  (1.676 m), weight 50.1 kg, SpO2 99 %.  Physical Exam Vitals reviewed.    I spoke to [facility] over the  Phone this afternoon.  Lab Results Lab Results  Component Value Date   WBC 5.2 06/18/2020   HGB 14.9 06/18/2020   HCT 44.1 06/18/2020   MCV 91.1 06/18/2020   PLT 281 06/18/2020    Lab Results  Component Value Date   CREATININE 1.05 06/18/2020   BUN 11 06/18/2020   NA 136 06/18/2020   K 4.0 06/18/2020   CL 101 06/18/2020   CO2 26 06/18/2020    Lab Results  Component Value Date   ALT 39 06/11/2020   AST 87 (H) 06/11/2020   ALKPHOS 26 (L) 06/11/2020   BILITOT 0.8 06/11/2020     Microbiology: Recent Results (from the past 240 hour(s))  Group A Strep by PCR ([facility] Only)     Status: None   Collection Time: 06/17/20  9:43 PM   Specimen: Throat; Sterile Swab  Result Value Ref Range Status   Group A Strep by PCR NOT DETECTED NOT DETECTED Final    Comment: Performed at [facility], [address] [place], [place]  SARS Coronavirus 2 by RT PCR (hospital order, performed in [facility] hospital lab) Nasopharyngeal Throat     Status: None   Collection Time: 06/17/20  9:43 PM   Specimen: Throat; Nasopharyngeal  Result Value Ref Range Status   SARS Coronavirus 2 NEGATIVE NEGATIVE Final    Comment: (NOTE) SARS-CoV-2 target nucleic acids are NOT DETECTED.  The SARS-CoV-2 RNA is generally detectable in upper and lower respiratory specimens during the acute phase of infection. The lowest concentration of SARS-CoV-2 viral copies this assay can detect is 250 copies / mL. A negative result does not preclude SARS-CoV-2 infection and should not be used as the sole basis for treatment or other patient management decisions.  A negative result may occur with improper  specimen collection / handling, submission of specimen other than nasopharyngeal swab, presence of viral mutation(s) within the areas targeted by this assay, and inadequate number of viral copies (<250 copies / mL). A negative result must be combined with clinical observations, patient history, and epidemiological information.  Fact Sheet for Patients:   08/18/20  Fact Sheet for Healthcare Providers: BoilerBrush.com.cy  This test is not yet approved or  cleared by the https://pope.com/ FDA and has been authorized for detection and/or diagnosis of SARS-CoV-2 by FDA under an Emergency Use Authorization (EUA).  This EUA will remain in effect (meaning this test can be used) for the duration of the COVID-19 declaration under Section 564(b)(1) of the Act, 21 U.S.C. section 360bbb-3(b)(1), unless the authorization is terminated or revoked sooner.  Performed at [facility], [address], [place], [place]  Blood culture (routine x 2)     Status: None (Preliminary result)   Collection Time: 06/17/20 10:36 PM   Specimen: BLOOD LEFT FOREARM  Result Value Ref Range Status   Specimen Description BLOOD LEFT FOREARM  Final   Special Requests   Final    BOTTLES DRAWN AEROBIC AND ANAEROBIC Blood Culture adequate volume   Culture   Final    NO GROWTH < 12 HOURS Performed at [facility], [address] [place], [place] [address]    Report Status PENDING  Incomplete  Blood culture (routine x 2)     Status: None (Preliminary result)   Collection Time: 06/17/20 10:36 PM   Specimen: Left Antecubital; Blood  Result Value Ref Range Status   Specimen Description LEFT ANTECUBITAL  Final   Special Requests   Final    BOTTLES DRAWN AEROBIC AND ANAEROBIC Blood Culture adequate volume   Culture   Final    NO GROWTH < 12 HOURS Performed at [facility], [address] [place], [place] [address]    Report  Status PENDING  Incomplete    Acey Lav, MD [facility] [phone] pager  06/18/2020, 2:06 PM

## 2020-06-18 NOTE — Progress Notes (Signed)
      INFECTIOUS DISEASE ATTENDING ADDENDUM:   Date: 06/18/2020  Patient name: Juan Vaughn  Medical record number: [id number]  Date of birth: 13-Nov-2001   Crypt ag CSF negative  [employer] 06/18/2020, 2:35 PM

## 2020-06-18 NOTE — Progress Notes (Addendum)
   06/18/20 1132  Assess: MEWS Score  Temp (!) 102.9 F (39.4 C)  BP (!) 96/48  Pulse Rate (!) 126  Resp 18  SpO2 100 %  O2 Device Room Air  Assess: MEWS Score  MEWS Temp 2  MEWS Systolic 1  MEWS Pulse 2  MEWS RR 0  MEWS LOC 0  MEWS Score 5  MEWS Score Color Red  Assess: if the MEWS score is Yellow or Red  Were vital signs taken at a resting state? Yes  Focused Assessment Documented focused assessment  Early Detection of Sepsis Score *See Row Information* Low  MEWS guidelines implemented *See Row Information* Yes  Take Vital Signs  Increase Vital Sign Frequency  Red: Q 1hr X 4 then Q 4hr X 4, if remains red, continue Q 4hrs  Escalate  MEWS: Escalate Red: discuss with charge nurse/RN and provider, consider discussing with RRT  Notify: Charge Nurse/RN  Name of Charge Nurse/RN Notified Judeth Cornfield RN   Date Charge Nurse/RN Notified 06/18/20  Time Charge Nurse/RN Notified 1140  Notify: Provider  Provider Name/Title Dr. Chipper Herb  Date Provider Notified 06/18/20  Time Provider Notified 1141  Notification Type Page (secure chat)  Notification Reason Other (Comment) (update)  Response See new orders  Date of Provider Response 06/18/20  Time of Provider Response 1141  Document  Patient Outcome Not stable and remains on department   Bolus ordered

## 2020-06-18 NOTE — H&P (Signed)
Juan Vaughn at [facility]   PATIENT NAME: Juan Vaughn    MR#:  [id number]  DATE OF BIRTH:  2001-07-29  DATE OF ADMISSION:  06/17/2020  PRIMARY CARE PHYSICIAN: Emogene Morgan, MD   REQUESTING/REFERRING PHYSICIAN: Willy Eddy, MD CHIEF COMPLAINT:   Chief Complaint  Patient presents with  . Migraine  . Nausea    HISTORY OF PRESENT ILLNESS:  Juan Vaughn  is a 19 y.o. mammogram male with a known history of migraine headache, who presented to the emergency room with acute onset of fever and chills as well as headache with associated nausea and photophobia.  He denied any vomiting or diarrhea or abdominal pain.  No cough or wheezing or dyspnea.  No dysuria, oliguria or hematuria or flank pain.  Denied any rhinorrhea or nasal congestion or sore throat or earache.  He denied any previous blood transfusions.  He admitted to being bisexual.  Labs revealed unremarkable CBC and BMP.  Lactic acid was 2.1.  Urinalysis showed 11-20 WBCs with trace leukocyte esterase.  Portable chest x-ray showed no acute cardiopulmonary disease.  Noncontrasted head CT scan revealed no acute intracranial abnormalities.  He had a negative abdominal and pelvic CT scan on 06/11/2020.  COVID-19 PCR came back negative.  HIV antibodies came back nonreactive however HIV-1 P 24 antigen was reactive. EKG showed sinus tachycardia with a rate of 111 with nonspecific T wave abnormalities.  Blood and urine cultures are pending.  Rapid strep test came back negative.  CSF analysis with lumbar puncture revealed 50 glucose, WBCs 12, 9984 mL and 31.  The patient was given IV cefepime, vancomycin and Flagyl as well as 30 mg IV and Toradol, and 25 mg of IV Benadryl and 1 L bolus of IV lactated Ringer and 1 L of IV normal saline, 10 mg of p.o. Reglan and 0.5 mg of IV Ativan.  He will be admitted to a medical monitored bed for further evaluation and management. PAST MEDICAL HISTORY:   Past Medical History:  Diagnosis Date    . Migraine     PAST SURGICAL HISTORY:  History reviewed. No pertinent surgical history. 9 mm surgeries  SOCIAL HISTORY:   Social History   Tobacco Use  . Smoking status: Never Smoker  . Smokeless tobacco: Never Used  Substance Use Topics  . Alcohol use: Never    FAMILY HISTORY:   Positive for diabetes mellitus that runs in his family.  His grandmother and CVA DRUG ALLERGIES:  No Known Allergies  REVIEW OF SYSTEMS:   ROS As per history of present illness. All pertinent systems were reviewed above. Constitutional,  HEENT, cardiovascular, respiratory, GI, GU, musculoskeletal, neuro, psychiatric, endocrine,  integumentary and hematologic systems were reviewed and are otherwise  negative/unremarkable except for positive findings mentioned above in the HPI.   MEDICATIONS AT HOME:   Prior to Admission medications   Medication Sig Start Date End Date Taking? Authorizing Provider  aluminum-magnesium hydroxide-simethicone (MAALOX) 200-200-20 MG/5ML SUSP Take 30 mLs by mouth 4 (four) times daily -  before meals and at bedtime. 06/11/20  Yes Sharman Cheek, MD  famotidine (PEPCID) 20 MG tablet Take 1 tablet (20 mg total) by mouth 2 (two) times daily. 06/11/20  Yes Sharman Cheek, MD  ondansetron (ZOFRAN ODT) 4 MG disintegrating tablet Take 1 tablet (4 mg total) by mouth every 8 (eight) hours as needed for nausea or vomiting. 06/11/20  Yes Sharman Cheek, MD  PROCTO-MED [facility] 2.5 % rectal cream Apply topically. 06/13/20  Yes [provider]  promethazine (PHENERGAN) 25 MG tablet Take 25 mg by mouth every 6 (six) hours as needed. 06/07/20  Yes [provider]  sodium fluoride (FLUORISHIELD) 1.1 % GEL dental gel SMARTSIG:Sparingly By Mouth Every Night 06/07/20  Yes [provider]      VITAL SIGNS:  Blood pressure 120/88, pulse (!) 107, temperature (!) 100.8 F (38.2 C), temperature source Oral, resp. rate 20, height 5\' 6"  (1.676 m), weight 48.5 kg, SpO2  96 %.  PHYSICAL EXAMINATION:  Physical Exam  GENERAL:  19 y.o.-year-old African-American male patient lying in the bed with no acute distress.  EYES: Pupils equal, round, reactive to light and accommodation. No scleral icterus. Extraocular muscles intact.  HEENT: Head atraumatic, normocephalic. Oropharynx and nasopharynx clear.  NECK:  Supple, no jugular venous distention. No thyroid enlargement, no tenderness.  LUNGS: Normal breath sounds bilaterally, no wheezing, rales,rhonchi or crepitation. No use of accessory muscles of respiration.  CARDIOVASCULAR: Regular rate and rhythm, S1, S2 normal. No murmurs, rubs, or gallops.  ABDOMEN: Soft, nondistended, nontender. Bowel sounds present. No organomegaly or mass.  EXTREMITIES: No pedal edema, cyanosis, or clubbing.  NEUROLOGIC: Cranial nerves II through XII are intact. Muscle strength 5/5 in all extremities. Sensation intact. Gait not checked.  PSYCHIATRIC: The patient is alert and oriented x 3.  Normal affect and good eye contact. SKIN: Warm with no obvious rash, lesion, or ulcer.   LABORATORY PANEL:   CBC Recent Labs  Lab 06/17/20 1936  WBC 6.2  HGB 15.2  HCT 44.1  PLT 286   ------------------------------------------------------------------------------------------------------------------  Chemistries  Recent Labs  Lab 06/11/20 0855 06/11/20 0855 06/17/20 1936  NA 134*   < > 137  K 4.0   < > 4.3  CL 99   < > 100  CO2 24   < > 25  GLUCOSE 130*   < > 100*  BUN 25*   < > 17  CREATININE 1.17   < > 1.21  CALCIUM 8.5*   < > 8.9  AST 87*  --   --   ALT 39  --   --   ALKPHOS 26*  --   --   BILITOT 0.8  --   --    < > = values in this interval not displayed.   ------------------------------------------------------------------------------------------------------------------  Cardiac Enzymes No results for input(s): TROPONINI in the last 168  hours. ------------------------------------------------------------------------------------------------------------------  RADIOLOGY:  DG Chest 1 View  Result Date: 06/17/2020 CLINICAL DATA:  19 year old male with fever. EXAM: CHEST  1 VIEW COMPARISON:  None. FINDINGS: The heart size and mediastinal contours are within normal limits. Both lungs are clear. The visualized skeletal structures are unremarkable. IMPRESSION: No active disease. Electronically Signed   By: 12 M.D.   On: 06/17/2020 22:13   CT Head Wo Contrast  Result Date: 06/18/2020 CLINICAL DATA:  Headache, dizziness EXAM: CT HEAD WITHOUT CONTRAST TECHNIQUE: Contiguous axial images were obtained from the base of the skull through the vertex without intravenous contrast. COMPARISON:  None FINDINGS: Brain: Some beam hardening along the calvarial results in artifactual hypoattenuation along the cerebral convexities. No convincing CT evidence of acute infarction, hemorrhage, hydrocephalus, extra-axial collection or mass lesion/mass effect. Benign dural calcifications. Midline intracranial structures are normal. Cerebellar tonsils are normally positioned. Vascular: No hyperdense vessel or unexpected calcification. Skull: No calvarial fracture or suspicious osseous lesion. No scalp swelling or hematoma. Sinuses/Orbits: Paranasal sinuses and mastoid air cells are predominantly clear. Included orbital structures are unremarkable. Other: None  IMPRESSION: No acute intracranial abnormality. Electronically Signed   By: Kreg Shropshire M.D.   On: 06/18/2020 00:52      IMPRESSION AND PLAN:   1.  Sepsis, possibly secondary to UTI with differential diagnosis including acute meningitis. -The patient will be admitted to a progressive unit bed. -She will be hydrated with IV normal saline. -She will be continued on antibiotic therapy with disease. -Rocephin 2 g every 12 hours. -We will follow his blood urine and sputum culture.  2.   HIV  disease. -This will be confirmed and an ID consultation will be obtained.  3.  Possible UTI. -This will be covered with IV Rocephin daily.  4.  Migraine. -Pain management will be provided.  5.  DVT prophylaxis. -Subcutaneous Lovenox   All the records are reviewed and case discussed with ED provider. The plan of care was discussed in details with the patient (and family). I answered all questions. The patient agreed to proceed with the above mentioned plan. Further management will depend upon hospital course.   CODE STATUS: Full code  Status is: Inpatient  Remains inpatient appropriate because:Hemodynamically unstable, Ongoing diagnostic testing needed not appropriate for outpatient work up, Unsafe d/c plan, IV treatments appropriate due to intensity of illness or inability to take PO and Inpatient level of care appropriate due to severity of illness   Dispo: The patient is from: Home              Anticipated d/c is to: Home              Anticipated d/c date is: 2 days              Patient currently is not medically stable to d/c.   TOTAL TIME TAKING CARE OF THIS PATIENT: 55 minutes.    Hannah Beat M.D on 06/18/2020 at 2:16 AM  [facility]   From 7 PM-7 AM, contact night-coverage [email or web address]  CC: Primary care physician; Emogene Morgan, MD   Note: This dictation was prepared with Dragon dictation along with smaller phrase technology. Any transcriptional typo errors that result from this process are unintentional.

## 2020-06-18 NOTE — Progress Notes (Deleted)
Dr. Arville Care in to see patient. Reported to MD about CSF lab results MD stated will place orders for antibiotics for patient.

## 2020-06-18 NOTE — Progress Notes (Signed)
Dr. Arville Care in to see patient. MD notified of CSF results. MD stated will put in orders for patient to receive additional antibiotics.

## 2020-06-18 NOTE — Plan of Care (Signed)

## 2020-06-18 NOTE — Progress Notes (Signed)
PHARMACY -  BRIEF ANTIBIOTIC NOTE   Pharmacy has received consult(s) for vanc/cefepime from an ED provider.  The patient's profile has been reviewed for ht/wt/allergies/indication/available labs.    One time order(s) placed for vanc 1g and cefepime 2g IV x 1.  Further antibiotics/pharmacy consults should be ordered by admitting physician if indicated.                       Thank you,  Thomasene Ripple, PharmD, BCPS Clinical Pharmacist 06/18/2020  1:46 AM

## 2020-06-18 NOTE — Progress Notes (Signed)
PROGRESS NOTE    [place]  VZD:[id number] DOB: January 28, 2001 DOA: 06/17/2020 PCP: Donnie Coffin, MD   Chief complaint.  High fever.   Brief Narrative: Patient is a 19 year old male came into the emergency room with sudden onset of fever chills, nausea, photophobia and  severe headache.  He had a significant nausea vomiting a few days back and he came to the emergency room, symptom has improved since that time.  He did not have any diarrhea.  Patient has a risk factor for HIV, but never been tested or treated before.  Upon arriving the emergency room, he had LP performed, showed a glucose 50, RBC 12, WBC 59 and protein 31.  He was treated with broad-spectrum antibiotics.  His HIV screening antibody was negative, P 24 antigen positive.  7/4.  Discussed with infectious disease Dr. Drucilla Schmidt, went over test results and patient presentation.  Ordered viral load and a CD4 cell count.  His impression is that patient has acute HIV syndrome, presented with viral meningitis.  Antibiotic discontinued.  Acyclovir started.  Also ordered cryptococcus antigen as well as HSV antigen. RPR also ordered.   Assessment & Plan:   Active Problems:   Pneumonia  #1.  Acute meningitis probably secondary to acute retroviral syndrome. Infect disease does not suspect any bacterial infection.  Antibiotics will be discontinued.  Pending results from RPR, CSF cryptococcus antigen, HSV.  Also start acyclovir for now.  Patient need to be in the hospital, most likely start antiviral treatment if cryptococcus antigen is negative. Patient headache has resolved today.  Still has a high fever secondary to HIV infection.  Continue symptomatic treatment.  2.  Sepsis. Most likely due to retroviral syndrome. Patient has no urinary symptoms.  Small number white cells in the urine with negative nitrates.  Does not support a UTI. I will continue IV fluids for now as patient still has high fever.  Continue symptomatic treatment.  3.   Headache secondary to meningitis. Headache resolved today.   DVT prophylaxis:SCDs Code Status: Full Family Communication: None Disposition Plan:  . Patient came from: Home            . Anticipated d/c place:Home . Barriers to d/c OR conditions which need to be met to effect a safe d/c:   Consultants:   ID  Procedures: LP Antimicrobials:  Acyclovir.  Subjective: Patient still has a high fever today.  No shaking chills.  No additional nausea.  No vomiting.  No abdominal pain.  No diarrhea. Headache has resolved this morning.  Objective: Vitals:   06/18/20 0250 06/18/20 0646 06/18/20 0708 06/18/20 0744  BP: 122/74  107/61   Pulse: 95  (!) 115   Resp:   18   Temp: 99.9 F (37.7 C) (!) 103.4 F (39.7 C) 98.4 F (36.9 C) (!) 103 F (39.4 C)  TempSrc:      SpO2: 100%  100%   Weight: 50.1 kg     Height: _0  (1.676 m)       Intake/Output Summary (Last 24 hours) at 06/18/2020 0837 Last data filed at 06/18/2020 0653 Gross per 24 hour  Intake 1832.07 ml  Output 1450 ml  Net 382.07 ml   Filed Weights   06/17/20 1913 06/18/20 0250  Weight: 48.5 kg 50.1 kg    Examination:  General exam: Appears calm and comfortable  Respiratory system: Clear to auscultation. Respiratory effort normal. Cardiovascular system: S1 & S2 heard, RRR. No JVD, murmurs, rubs, gallops or clicks. No  pedal edema. Gastrointestinal system: Abdomen is nondistended, soft and nontender. No organomegaly or masses felt. Normal bowel sounds heard. Central nervous system: Alert and oriented. No focal neurological deficits. Extremities: Symmetric 5 x 5 power. Skin: No rashes, lesions or ulcers Psychiatry: Judgement and insight appear normal. Mood & affect appropriate.     Data Reviewed: I have personally reviewed following labs and imaging studies  CBC: Recent Labs  Lab 06/11/20 0855 06/11/20 0856 06/17/20 1936 06/18/20 0626  WBC 3.0* 3.0* 6.2 5.2  NEUTROABS  --  1.7  --   --   HGB 16.6 16.6  15.2 14.9  HCT 47.8 47.6 44.1 44.1  MCV 88.4 88.3 88.4 91.1  PLT 65* 64* 286 761   Basic Metabolic Panel: Recent Labs  Lab 06/11/20 0855 06/17/20 1936 06/18/20 0626  NA 134* 137 136  K 4.0 4.3 4.0  CL 99 100 101  CO2 _0 GLUCOSE 130* 100* 102*  BUN 25* 17 11  CREATININE 1.17 1.21 1.05  CALCIUM 8.5* 8.9 8.3*   GFR: Estimated Creatinine Clearance: 80.2 mL/min (by C-G formula based on SCr of 1.05 mg/dL). Liver Function Tests: Recent Labs  Lab 06/11/20 0855  AST 87*  ALT 39  ALKPHOS 26*  BILITOT 0.8  PROT 6.7  ALBUMIN 4.0   Recent Labs  Lab 06/11/20 0855  LIPASE 252*   No results for input(s): AMMONIA in the last 168 hours. Coagulation Profile: No results for input(s): INR, PROTIME in the last 168 hours. Cardiac Enzymes: No results for input(s): CKTOTAL, CKMB, CKMBINDEX, TROPONINI in the last 168 hours. BNP (last 3 results) No results for input(s): PROBNP in the last 8760 hours. HbA1C: No results for input(s): HGBA1C in the last 72 hours. CBG: Recent Labs  Lab 06/17/20 1934  GLUCAP 90   Lipid Profile: No results for input(s): CHOL, HDL, LDLCALC, TRIG, CHOLHDL, LDLDIRECT in the last 72 hours. Thyroid Function Tests: No results for input(s): TSH, T4TOTAL, FREET4, T3FREE, THYROIDAB in the last 72 hours. Anemia Panel: No results for input(s): VITAMINB12, FOLATE, FERRITIN, TIBC, IRON, RETICCTPCT in the last 72 hours. Sepsis Labs: Recent Labs  Lab 06/17/20 2236  LATICACIDVEN 2.1*    Recent Results (from the past 240 hour(s))  Group A Strep by PCR ([facility] Only)     Status: None   Collection Time: 06/17/20  9:43 PM   Specimen: Throat; Sterile Swab  Result Value Ref Range Status   Group A Strep by PCR NOT DETECTED NOT DETECTED Final    Comment: Performed at [facility], [address] [place], [place] [address]  SARS Coronavirus 2 by RT PCR (hospital order, performed in [facility] hospital lab) Nasopharyngeal Throat     Status: None    Collection Time: 06/17/20  9:43 PM   Specimen: Throat; Nasopharyngeal  Result Value Ref Range Status   SARS Coronavirus 2 NEGATIVE NEGATIVE Final    Comment: (NOTE) SARS-CoV-2 target nucleic acids are NOT DETECTED.  The SARS-CoV-2 RNA is generally detectable in upper and lower respiratory specimens during the acute phase of infection. The lowest concentration of SARS-CoV-2 viral copies this assay can detect is 250 copies / mL. A negative result does not preclude SARS-CoV-2 infection and should not be used as the sole basis for treatment or other patient management decisions.  A negative result may occur with improper specimen collection / handling, submission of specimen other than nasopharyngeal swab, presence of viral mutation(s) within the areas targeted by this assay, and inadequate number of  viral copies (<250 copies / mL). A negative result must be combined with clinical observations, patient history, and epidemiological information.  Fact Sheet for Patients:   StrictlyIdeas.no  Fact Sheet for Healthcare Providers: BankingDealers.co.za  This test is not yet approved or  cleared by the [place] FDA and has been authorized for detection and/or diagnosis of SARS-CoV-2 by FDA under an Emergency Use Authorization (EUA).  This EUA will remain in effect (meaning this test can be used) for the duration of the COVID-19 declaration under Section 564(b)(1) of the Act, 21 U.S.C. section 360bbb-3(b)(1), unless the authorization is terminated or revoked sooner.  Performed at [facility], [address] [place], [place] [address]   Blood culture (routine x 2)     Status: None (Preliminary result)   Collection Time: 06/17/20 10:36 PM   Specimen: BLOOD LEFT FOREARM  Result Value Ref Range Status   Specimen Description BLOOD LEFT FOREARM  Final   Special Requests   Final    BOTTLES DRAWN AEROBIC AND ANAEROBIC Blood Culture  adequate volume   Culture   Final    NO GROWTH < 12 HOURS Performed at [facility], [address], [place], [place] [address]    Report Status PENDING  Incomplete  Blood culture (routine x 2)     Status: None (Preliminary result)   Collection Time: 06/17/20 10:36 PM   Specimen: Left Antecubital; Blood  Result Value Ref Range Status   Specimen Description LEFT ANTECUBITAL  Final   Special Requests   Final    BOTTLES DRAWN AEROBIC AND ANAEROBIC Blood Culture adequate volume   Culture   Final    NO GROWTH < 12 HOURS Performed at [facility], [address], [place], [place] [address]    Report Status PENDING  Incomplete         Radiology Studies: DG Chest 1 View  Result Date: 06/17/2020 CLINICAL DATA:  19 year old male with fever. EXAM: CHEST  1 VIEW COMPARISON:  None. FINDINGS: The heart size and mediastinal contours are within normal limits. Both lungs are clear. The visualized skeletal structures are unremarkable. IMPRESSION: No active disease. Electronically Signed   By: Anner Crete M.D.   On: 06/17/2020 22:13   CT Head Wo Contrast  Result Date: 06/18/2020 CLINICAL DATA:  Headache, dizziness EXAM: CT HEAD WITHOUT CONTRAST TECHNIQUE: Contiguous axial images were obtained from the base of the skull through the vertex without intravenous contrast. COMPARISON:  None FINDINGS: Brain: Some beam hardening along the calvarial results in artifactual hypoattenuation along the cerebral convexities. No convincing CT evidence of acute infarction, hemorrhage, hydrocephalus, extra-axial collection or mass lesion/mass effect. Benign dural calcifications. Midline intracranial structures are normal. Cerebellar tonsils are normally positioned. Vascular: No hyperdense vessel or unexpected calcification. Skull: No calvarial fracture or suspicious osseous lesion. No scalp swelling or hematoma. Sinuses/Orbits: Paranasal sinuses and mastoid air cells are predominantly clear.  Included orbital structures are unremarkable. Other: None IMPRESSION: No acute intracranial abnormality. Electronically Signed   By: Lovena Le M.D.   On: 06/18/2020 00:52        Scheduled Meds: . alum & mag hydroxide-simeth  30 mL Oral TID AC & HS  . enoxaparin (LOVENOX) injection  40 mg Subcutaneous Q24H  . famotidine  20 mg Oral BID   Continuous Infusions: . sodium chloride 100 mL/hr at 06/18/20 0350  . sodium chloride 250 mL (06/18/20 0418)  . acyclovir       LOS: 0 days    Time  spent: 40 minutes    Sharen Hones, MD [facility]   To contact the attending provider between 7A-7P or the covering provider during after hours 7P-7A, please log into the web site [email or web address] and access using universal [place] password for that web site. If you do not have the password, please call the hospital operator.  06/18/2020, 8:37 AM

## 2020-06-18 NOTE — Progress Notes (Signed)
Charge nurse notified about patient elevated temp of 103.4. Medications in place pt given Tylenol 650 mg PO. MD aware that pt has fever at times, and gave verbal order to give Advil 400 mg Q4hrn prn at 0330 if needed. Medicated now ordered.

## 2020-06-18 NOTE — Progress Notes (Addendum)
   06/18/20 1045  Assess: MEWS Score  Temp (!) 103.1 F (39.5 C)  BP 121/73  Pulse Rate (!) 111  Resp 20  SpO2 100 %  O2 Device Room Air  Assess: MEWS Score  MEWS Temp 2  MEWS Systolic 0  MEWS Pulse 2  MEWS RR 0  MEWS LOC 0  MEWS Score 4  MEWS Score Color Red  Assess: if the MEWS score is Yellow or Red  Were vital signs taken at a resting state? Yes  Focused Assessment Documented focused assessment  Early Detection of Sepsis Score *See Row Information* Low  MEWS guidelines implemented *See Row Information* No, previously red, continue vital signs every 4 hours  Treat  MEWS Interventions Administered prn meds/treatments (alternate between tylenol and ibuprofen )  Notify: Charge Nurse/RN  Name of Charge Nurse/RN Notified Stephanie RN  Date Charge Nurse/RN Notified 06/18/20  Time Charge Nurse/RN Notified 1050  Notify: Provider  Provider Name/Title Dr. Chipper Herb   Date Provider Notified 06/18/20  Time Provider Notified 1055  Notification Type Page (secure chat )  Notification Reason Other (Comment) (update)  Response No new orders (alternate between tylenol and ibuprofen )  Date of Provider Response 06/18/20  Time of Provider Response 1100

## 2020-06-18 NOTE — ED Notes (Signed)
Pt taken to CT at this time.

## 2020-06-18 NOTE — Progress Notes (Signed)
CH visited pt. per OR for AD; when [facility] arrived, pt. unaware of requesting info re: AD, but amenable to discuss document and interested in completing it.  Pt. prefers to look over document himself; [facility] will follow up later today to assess if pt. needs assistance.      06/18/20 1315  Clinical Encounter Type  Visited With Patient;Health care provider  Visit Type Initial;Psychological support;Social support (Advanced care planning)  Spiritual Encounters  Spiritual Needs Emotional  Advance Directives (For Healthcare)  Does Patient Have a Medical Advance Directive? No  Does patient want to make changes to medical advance directive? Yes (Inpatient - patient defers changing a medical advance directive at this time - Information given)

## 2020-06-19 DIAGNOSIS — G030 Nonpyogenic meningitis: Secondary | ICD-10-CM

## 2020-06-19 DIAGNOSIS — E43 Unspecified severe protein-calorie malnutrition: Secondary | ICD-10-CM | POA: Diagnosis present

## 2020-06-19 LAB — HEPATITIS C ANTIBODY: HCV Ab: NONREACTIVE

## 2020-06-19 LAB — BASIC METABOLIC PANEL
Anion gap: 5 (ref 5–15)
BUN: 9 mg/dL (ref 6–20)
CO2: 27 mmol/L (ref 22–32)
Calcium: 7.6 mg/dL — ABNORMAL LOW (ref 8.9–10.3)
Chloride: 104 mmol/L (ref 98–111)
Creatinine, Ser: 0.93 mg/dL (ref 0.61–1.24)
GFR calc Af Amer: >60 mL/min (ref >60)
GFR calc non Af Amer: >60 mL/min (ref >60)
Glucose, Bld: 96 mg/dL (ref 70–99)
Potassium: 4.1 mmol/L (ref 3.5–5.1)
Sodium: 136 mmol/L (ref 135–145)

## 2020-06-19 LAB — URINE CULTURE
Culture: NO GROWTH
Culture: NO GROWTH

## 2020-06-19 LAB — HEPATIC FUNCTION PANEL
ALT: 125 U/L — ABNORMAL HIGH (ref 0–44)
AST: 139 U/L — ABNORMAL HIGH (ref 15–41)
Albumin: 3.2 g/dL — ABNORMAL LOW (ref 3.5–5.0)
Alkaline Phosphatase: 33 U/L — ABNORMAL LOW (ref 38–126)
Bilirubin, Direct: 0.1 mg/dL (ref 0.0–0.2)
Indirect Bilirubin: 0.5 mg/dL (ref 0.3–0.9)
Total Bilirubin: 0.6 mg/dL (ref 0.3–1.2)
Total Protein: 5.7 g/dL — ABNORMAL LOW (ref 6.5–8.1)

## 2020-06-19 LAB — CBC WITH DIFFERENTIAL/PLATELET
Abs Immature Granulocytes: 0.08 10*3/uL — ABNORMAL HIGH (ref 0.00–0.07)
Basophils Absolute: 0 10*3/uL (ref 0.0–0.1)
Basophils Relative: 0 %
Eosinophils Absolute: 0 10*3/uL (ref 0.0–0.5)
Eosinophils Relative: 0 %
HCT: 37.4 % — ABNORMAL LOW (ref 39.0–52.0)
Hemoglobin: 12.9 g/dL — ABNORMAL LOW (ref 13.0–17.0)
Immature Granulocytes: 2 %
Lymphocytes Relative: 25 %
Lymphs Abs: 0.8 10*3/uL (ref 0.7–4.0)
MCH: 30.6 pg (ref 26.0–34.0)
MCHC: 34.5 g/dL (ref 30.0–36.0)
MCV: 88.6 fL (ref 80.0–100.0)
Monocytes Absolute: 0.2 10*3/uL (ref 0.1–1.0)
Monocytes Relative: 5 %
Neutro Abs: 2.2 10*3/uL (ref 1.7–7.7)
Neutrophils Relative %: 68 %
Platelets: 243 10*3/uL (ref 150–400)
RBC: 4.22 MIL/uL (ref 4.22–5.81)
RDW: 12.3 % (ref 11.5–15.5)
Smear Review: NORMAL
WBC: 3.3 10*3/uL — ABNORMAL LOW (ref 4.0–10.5)
nRBC: 0 % (ref 0.0–0.2)

## 2020-06-19 LAB — RPR: RPR Ser Ql: NONREACTIVE

## 2020-06-19 LAB — MAGNESIUM: Magnesium: 1.9 mg/dL (ref 1.7–2.4)

## 2020-06-19 LAB — HEPATITIS A ANTIBODY, TOTAL: hep A Total Ab: REACTIVE — AB

## 2020-06-19 LAB — HEPATITIS B SURFACE ANTIGEN: Hepatitis B Surface Ag: NONREACTIVE

## 2020-06-19 MED ORDER — ADULT MULTIVITAMIN W/MINERALS CH
1.0000 | ORAL_TABLET | Freq: Every day | ORAL | Status: DC
  Administered 2020-06-20 – 2020-06-21 (×2): 1 via ORAL
  Filled 2020-06-19 (×2): qty 1

## 2020-06-19 MED ORDER — ENSURE ENLIVE PO LIQD
237.0000 mL | Freq: Three times a day (TID) | ORAL | Status: DC
  Administered 2020-06-19 – 2020-06-21 (×7): 237 mL via ORAL

## 2020-06-19 NOTE — Progress Notes (Signed)
   06/19/20 1100  Clinical Encounter Type  Visited With Patient  Visit Type Follow-up;Spiritual support;Social support  Referral From Patient;Nurse  Consult/Referral To Chaplain  Followed-up on AD. I went to the Pt room to check on AD. Pt filled out AD. Ch went back and finished AD. Ch will follow-up with Pt.

## 2020-06-19 NOTE — Progress Notes (Signed)
CH returned to rm. in evening to see if pt. needed any assistance w/AD; pt. sitting in bed eating fruit tray w/family member in chair at window.  Pt. says he has not yet worked on document; CH is available if pt needs any assistance.    06/18/20 1800  Clinical Encounter Type  Visited With Patient and family together  Visit Type Follow-up;Social support;Psychological support

## 2020-06-19 NOTE — Progress Notes (Signed)
PROGRESS NOTE    Juan Vaughn  TMH:[id number] DOB: 2001-02-04 DOA: 06/17/2020 PCP: Donnie Coffin, MD   Chief complaint.  Fever.   Brief Narrative:  Patient is a 19 year old male came into the emergency room with sudden onset of fever chills, nausea, photophobia and  severe headache.  He had a significant nausea vomiting a few days back and he came to the emergency room, symptom has improved since that time.  He did not have any diarrhea.  Patient has a risk factor for HIV, but never been tested or treated before.  Upon arriving the emergency room, he had LP performed, showed a glucose 50, RBC 12, WBC 59 and protein 31.  He was treated with broad-spectrum antibiotics.  His HIV screening antibody was negative, P 24 antigen positive.  7/4.  Discussed with infectious disease Dr. Drucilla Schmidt, went over test results and patient presentation.  Ordered viral load and a CD4 cell count.  His impression is that patient has acute HIV syndrome, presented with viral meningitis.  Antibiotic discontinued.  Acyclovir started.  Also ordered cryptococcus antigen as well as HSV antigen. RPR also ordered.  7/5.  Cryptococcus antigen was negative.  Started Biktarvy yesterday by infectious disease.  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  Aseptic meningitis. Still pending HSV.  Continue acyclovir.  Most likely this is secondary to acute retrovirus infection.  Still has a high fever, continue IV fluid for today.  2.  Acute retroviral syndrome. Started antiviral treatment yesterday.  3.  Sepsis. Condition stable.   DVT prophylaxis:SCDs Code Status: Full Family Communication: None Disposition Plan:   Patient came from: Home                                                                                                                           Anticipated d/c place:Home  Barriers to d/c OR conditions which need to be  met to effect a safe d/c:  Planning discharge home tomorrow. Consultants:   ID  Procedures: LP Antimicrobials:  Acyclovir.     Subjective: Still had a fever last night.  Otherwise condition had improved.  He received 1000 mL of normal saline bolus yesterday due to relative hypotension. Denies any headache or nausea vomiting today.  Good appetite. Objective: Vitals:   06/19/20 0526 06/19/20 0600 06/19/20 0619 06/19/20 0800  BP: 114/66 (!) 103/50  125/61  Pulse: (!) 109 (!) 106  (!) 101  Resp:      Temp: (!) 101.2 F (38.4 C)  98.3 F (36.8 C) 100 F (37.8 C)  TempSrc: Oral  Oral Oral  SpO2: 99% 98%  100%  Weight:      Height:        Intake/Output Summary (Last 24 hours) at 06/19/2020 0823 Last data filed at 06/19/2020 0817 Gross per 24 hour  Intake 1687.12 ml  Output 1800 ml  Net -  112.88 ml   Filed Weights   06/17/20 1913 06/18/20 0250  Weight: 48.5 kg 50.1 kg    Examination:  General exam: Appears calm and comfortable  Respiratory system: Clear to auscultation. Respiratory effort normal. Cardiovascular system: S1 & S2 heard, RRR. No JVD, murmurs, rubs, gallops or clicks. No pedal edema. Gastrointestinal system: Abdomen is nondistended, soft and nontender. No organomegaly or masses felt. Normal bowel sounds heard. Central nervous system: Alert and oriented. No focal neurological deficits. Extremities: Symmetric 5 x 5 power. Skin: No rashes, lesions or ulcers Psychiatry: Judgement and insight appear normal. Mood & affect appropriate.     Data Reviewed: I have personally reviewed following labs and imaging studies  CBC: Recent Labs  Lab 06/17/20 1936 06/18/20 0626 06/19/20 0517  WBC 6.2 5.2 3.3*  NEUTROABS  --   --  2.2  HGB 15.2 14.9 12.9*  HCT 44.1 44.1 37.4*  MCV 88.4 91.1 88.6  PLT 286 281 408   Basic Metabolic Panel: Recent Labs  Lab 06/17/20 1936 06/18/20 0626 06/19/20 0517  NA 137 136 136  K 4.3 4.0 4.1  CL 100 101 104  CO2 _0 GLUCOSE 100* 102* 96  BUN _1 CREATININE 1.21 1.05 0.93  CALCIUM 8.9 8.3* 7.6*  MG  --   --  1.9   GFR: Estimated Creatinine Clearance: 90.5 mL/min (by C-G formula based on SCr of 0.93 mg/dL). Liver Function Tests: No results for input(s): AST, ALT, ALKPHOS, BILITOT, PROT, ALBUMIN in the last 168 hours. No results for input(s): LIPASE, AMYLASE in the last 168 hours. No results for input(s): AMMONIA in the last 168 hours. Coagulation Profile: No results for input(s): INR, PROTIME in the last 168 hours. Cardiac Enzymes: No results for input(s): CKTOTAL, CKMB, CKMBINDEX, TROPONINI in the last 168 hours. BNP (last 3 results) No results for input(s): PROBNP in the last 8760 hours. HbA1C: No results for input(s): HGBA1C in the last 72 hours. CBG: Recent Labs  Lab 06/17/20 1934  GLUCAP 90   Lipid Profile: No results for input(s): CHOL, HDL, LDLCALC, TRIG, CHOLHDL, LDLDIRECT in the last 72 hours. Thyroid Function Tests: No results for input(s): TSH, T4TOTAL, FREET4, T3FREE, THYROIDAB in the last 72 hours. Anemia Panel: No results for input(s): VITAMINB12, FOLATE, FERRITIN, TIBC, IRON, RETICCTPCT in the last 72 hours. Sepsis Labs: Recent Labs  Lab 06/17/20 2236  LATICACIDVEN 2.1*    Recent Results (from the past 240 hour(s))  Urine culture     Status: None   Collection Time: 06/17/20  7:36 PM   Specimen: Urine, Clean Catch  Result Value Ref Range Status   Specimen Description   Final    URINE, CLEAN CATCH Performed at [facility], [address], [place], [place] [address]    Special Requests   Final    NONE Performed at [facility], [address] [place], [place] [address]    Culture   Final    NO GROWTH Performed at [facility], [place] [address] [place],  [address]    Report Status 06/19/2020 FINAL  Final  Chlamydia/NGC rt PCR ([place] only)     Status: None   Collection Time: 06/17/20  7:36 PM   Specimen: Urine  Result  Value Ref Range Status   Specimen source GC/Chlam URINE, RANDOM  Final   Chlamydia Tr NOT DETECTED NOT DETECTED Final   N gonorrhoeae NOT DETECTED NOT DETECTED Final    Comment: (NOTE) This  CT/NG assay has not been evaluated in patients with a history of  hysterectomy. Performed at [facility] [place], [place] [address]   Group A Strep by PCR [facility] Only)     Status: None   Collection Time: 06/17/20  9:43 PM   Specimen: Throat; Sterile Swab  Result Value Ref Range Status   Group A Strep by PCR NOT DETECTED NOT DETECTED Final    Comment: Performed at [facility], [address] [place], [place] [address]  SARS Coronavirus 2 by RT PCR (hospital order, performed in [facility] hospital lab) Nasopharyngeal Throat     Status: None   Collection Time: 06/17/20  9:43 PM   Specimen: Throat; Nasopharyngeal  Result Value Ref Range Status   SARS Coronavirus 2 NEGATIVE NEGATIVE Final    Comment: (NOTE) SARS-CoV-2 target nucleic acids are NOT DETECTED.  The SARS-CoV-2 RNA is generally detectable in upper and lower respiratory specimens during the acute phase of infection. The lowest concentration of SARS-CoV-2 viral copies this assay can detect is 250 copies / mL. A negative result does not preclude SARS-CoV-2 infection and should not be used as the sole basis for treatment or other patient management decisions.  A negative result may occur with improper specimen collection / handling, submission of specimen other than nasopharyngeal swab, presence of viral mutation(s) within the areas targeted by this assay, and inadequate number of viral copies (<250 copies / mL). A negative result must be combined with clinical observations, patient history, and epidemiological information.  Fact Sheet for Patients:   StrictlyIdeas.no  Fact Sheet for Healthcare Providers: BankingDealers.co.za  This test is not yet  approved or  cleared by the [place] FDA and has been authorized for detection and/or diagnosis of SARS-CoV-2 by FDA under an Emergency Use Authorization (EUA).  This EUA will remain in effect (meaning this test can be used) for the duration of the COVID-19 declaration under Section 564(b)(1) of the Act, 21 U.S.C. section 360bbb-3(b)(1), unless the authorization is terminated or revoked sooner.  Performed at [facility], [address] [place], [place] [address]   Blood culture (routine x 2)     Status: None (Preliminary result)   Collection Time: 06/17/20 10:36 PM   Specimen: BLOOD LEFT FOREARM  Result Value Ref Range Status   Specimen Description BLOOD LEFT FOREARM  Final   Special Requests   Final    BOTTLES DRAWN AEROBIC AND ANAEROBIC Blood Culture adequate volume   Culture   Final    NO GROWTH 2 DAYS Performed at [facility], [address], [place], [place] [address]    Report Status PENDING  Incomplete  Blood culture (routine x 2)     Status: None (Preliminary result)   Collection Time: 06/17/20 10:36 PM   Specimen: Left Antecubital; Blood  Result Value Ref Range Status   Specimen Description LEFT ANTECUBITAL  Final   Special Requests   Final    BOTTLES DRAWN AEROBIC AND ANAEROBIC Blood Culture adequate volume   Culture   Final    NO GROWTH 2 DAYS Performed at [facility], [address] [place], [place] [address]    Report Status PENDING  Incomplete  Urine culture     Status: None   Collection Time: 06/17/20 10:52 PM   Specimen: Urine, Clean Catch  Result Value Ref Range Status   Specimen Description   Final    URINE, CLEAN CATCH Performed at [facility], [place]  [address] [place], [place] [address]    Special Requests   Final    NONE Performed at [facility], [address] [place], [place] [address]    Culture   Final    NO GROWTH Performed at [facility], [place] [address], [place],  [place] [address]    Report Status 06/19/2020 FINAL  Final         Radiology Studies: DG Chest 1 View  Result Date: 06/17/2020 CLINICAL DATA:  19 year old male with fever. EXAM: CHEST  1 VIEW COMPARISON:  None. FINDINGS: The heart size and mediastinal contours are within normal limits. Both lungs are clear. The visualized skeletal structures are unremarkable. IMPRESSION: No active disease. Electronically Signed   By: Anner Crete M.D.   On: 06/17/2020 22:13   CT Head Wo Contrast  Result Date: 06/18/2020 CLINICAL DATA:  Headache, dizziness EXAM: CT HEAD WITHOUT CONTRAST TECHNIQUE: Contiguous axial images were obtained from the base of the skull through the vertex without intravenous contrast. COMPARISON:  None FINDINGS: Brain: Some beam hardening along the calvarial results in artifactual hypoattenuation along the cerebral convexities. No convincing CT evidence of acute infarction, hemorrhage, hydrocephalus, extra-axial collection or mass lesion/mass effect. Benign dural calcifications. Midline intracranial structures are normal. Cerebellar tonsils are normally positioned. Vascular: No hyperdense vessel or unexpected calcification. Skull: No calvarial fracture or suspicious osseous lesion. No scalp swelling or hematoma. Sinuses/Orbits: Paranasal sinuses and mastoid air cells are predominantly clear. Included orbital structures are unremarkable. Other: None IMPRESSION: No acute intracranial abnormality. Electronically Signed   By: Lovena Le M.D.   On: 06/18/2020 00:52        Scheduled Meds: . alum & mag hydroxide-simeth  30 mL Oral TID AC & HS  . bictegravir-emtricitabine-tenofovir AF  1 tablet Oral Daily  . enoxaparin (LOVENOX) injection  40 mg Subcutaneous Q24H  . famotidine  20 mg Oral BID   Continuous Infusions: . sodium chloride 100 mL/hr at 06/19/20 0818  . sodium chloride Stopped (06/18/20 0906)  . acyclovir 500 mg (06/19/20 0220)     LOS: 1 day    Time spent:28  minutes    Sharen Hones, MD [facility]   To contact the attending provider between 7A-7P or the covering provider during after hours 7P-7A, please log into the web site [email or web address] and access using universal [place] password for that web site. If you do not have the password, please call the hospital operator.  06/19/2020, 8:23 AM

## 2020-06-19 NOTE — Progress Notes (Signed)
Initial Nutrition Assessment  DOCUMENTATION CODES:   Severe malnutrition in context of acute illness/injury  INTERVENTION:   Ensure Enlive po BID, each supplement provides 350 kcal and 20 grams of protein  MVI daily  Liberalize diet   Pt at moderate refeed risk; recommend monitor K, Mg and P labs as oral intake improves.   NUTRITION DIAGNOSIS:   Severe Malnutrition related to acute illness as evidenced by 12 percent weight loss in < 2 weeks, mild fat depletion, moderate to severe muscle depletions.  GOAL:   Patient will meet greater than or equal to 90% of their needs  MONITOR:   PO intake, Supplement acceptance, Labs, Weight trends, Skin, I & O's  REASON FOR ASSESSMENT:   Malnutrition Screening Tool    ASSESSMENT:   18 y/o male with acute HIV syndrome secondary to aseptic meningitis and PNA   Met with pt in room today. Pt reports decreased appetite and oral intake for the past week. Pt reports that his appetite is improving but is not back to normal; pt eating ~50% of meals in hospital. Per chart, pt is down 15lbs(12%) in < 2 weeks; this is severe weight loss. RD will add supplements and MVI to help pt meet his estimated needs. Pt would like vanilla or strawberry flavors. RD will also liberalize pt's diet. Pt is at refeed risk.   Medications reviewed and include: lovenox, pepcid, NaCl @100ml/hr  Labs reviewed: wbc 3.3(L)  NUTRITION - FOCUSED PHYSICAL EXAM:    Most Recent Value  Orbital Region No depletion  Upper Arm Region Mild depletion  Thoracic and Lumbar Region Mild depletion  Buccal Region No depletion  Temple Region No depletion  Clavicle Bone Region Mild depletion  Clavicle and Acromion Bone Region Mild depletion  Scapular Bone Region Mild depletion  Dorsal Hand Mild depletion  Patellar Region Moderate depletion  Anterior Thigh Region Severe depletion  Posterior Calf Region Severe depletion  Edema (RD Assessment) None  Hair Reviewed  Eyes Reviewed   Mouth Reviewed  Skin Reviewed  Nails Reviewed     Diet Order:   Diet Order            Diet regular Room service appropriate? Yes; Fluid consistency: Thin  Diet effective now                EDUCATION NEEDS:   Education needs have been addressed  Skin:  Skin Assessment: Reviewed RN Assessment  Last BM:  7/4  Height:   Ht Readings from Last 1 Encounters:  06/18/20 5' 6" (1.676 m) (10 %, Z= -1.28)*   * Growth percentiles are based on CDC (Boys, 2-20 Years) data.    Weight:   Wt Readings from Last 1 Encounters:  06/18/20 50.1 kg (<1 %, Z= -2.48)*   * Growth percentiles are based on CDC (Boys, 2-20 Years) data.    Ideal Body Weight:  64.5 kg  BMI:  Body mass index is 17.82 kg/m.  Estimated Nutritional Needs:   Kcal:  1900-2200kcal/day  Protein:  95-110g/day  Fluid:  >1.5L/day  Casey Campbell MS, RD, LDN Please refer to AMION for RD and/or RD on-call/weekend/after hours pager  

## 2020-06-19 NOTE — Consult Note (Signed)
NAMPearson Vaughn: Lewi Manukyan  DOB: Mar 05, 2001  MRN: [id number]  Date/Time: 06/19/2020 1:25 PM  REQUESTING PROVIDER: Dr. Chipper HerbZhang Subjective:  REASON FOR CONSULT: Aseptic meningitis? Juan GrippeKevaun Foucher is a 19 y.o. male with a history of with no significant past medical history admitted to the hospital with headache abdominal pain and nausea. Patient had presented to the ED on 06/11/2020 with nausea vomiting and diarrhea and generalized abdominal pain worse in the right lower quadrant started had 2 days before.  Vitals that time in the ED on 06/11/2020 was a heart rate of 112, BP of 101/70, temperature of 99.6.  His labs then were WBC of 3, hemoglobin of 16.6, platelet of 65, creatinine of 1.17, AST of 87, total protein of 6.7.  He had a CT abdomen and pelvis with contrast which was unremarkable.  During this presentation his vitals were temperature of 98.9 on admission which later went up to 103, pulse rate of 114, BP of 100/63. Labs showed a WBC of 6.2, hemoglobin of 15.2, platelet count of 286, creatinine of 1.21 and lactate of 2.1.  Lumbar puncture was done and it showed 59 WBCs with 84% lymphocytes, protein of 31 and glucose of 50.  HIV test was reactive for P 24 antigen..  Cryptococcal antigen was negative.  RPR was nonreactive.   Over the weekend on call ID discussed with him on the phone and started him on Biktarvy Pt says he has lost 18 pounds in the past 3 weeks- His illbess started around 6/20 and he has been fatigued, not his usual self. H/o sexual encounters with men and women. Denies receptive or insertive anal intercourse Knows the male  who he had sex with who was positive for HIV . 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 Acute HIV infection (HCC) 06/18/2020  . Migraine     History reviewed. No pertinent surgical history.  Social History   Socioeconomic  History  . Marital status: Single    Spouse name: Not on file  . Number of children: Not on file  . Years of education: Not on file  . Highest education level: Not on file  Occupational History  . Not on file  Tobacco Use  . Smoking status: Never Smoker  . Smokeless tobacco: Never Used  Vaping Use  . Vaping Use: Never used  Substance and Sexual Activity  . Alcohol use: Never  . Drug use: Never  . Sexual activity: Not Currently  Other Topics Concern  . Not on file  Social History Narrative  . Not on file   Social Determinants of Health   Financial Resource Strain:   . Difficulty of Paying Living Expenses:   Food Insecurity:   . Worried About Programme researcher, broadcasting/film/videounning Out of Food in the Last Year:   . Baristaan Out of Food in the Last Year:   Transportation Needs:   . Freight forwarderLack of Transportation (Medical):   Marland Kitchen. Lack of Transportation (Non-Medical):   Physical Activity:   . Days of Exercise per Week:   . Minutes of Exercise per Session:   Stress:   . Feeling of Stress :   Social Connections:   . Frequency of Communication with Friends and Family:   . Frequency of Social Gatherings with Friends and Family:   . Attends Religious Services:   . Active Member of Clubs or Organizations:   .  Attends Banker Meetings:   Marland Kitchen Marital Status:   Intimate Partner Violence:   . Fear of Current or Ex-Partner:   . Emotionally Abused:   Marland Kitchen Physically Abused:   . Sexually Abused:     No family history on file. No Known Allergies  Current Facility-Administered Medications  Medication Dose Route Frequency Provider Last Rate Last Admin  . 0.9 %  sodium chloride infusion   Intravenous Continuous Mansy, Jan A, MD 100 mL/hr at 06/19/20 0818 New Bag at 06/19/20 0818  . 0.9 %  sodium chloride infusion   Intravenous PRN Mansy, Vernetta Honey, MD   Stopped at 06/18/20 [phone]  . acetaminophen (TYLENOL) tablet 650 mg  650 mg Oral Q6H PRN Mansy, Jan A, MD   650 mg at 06/19/20 1012   Or  . acetaminophen (TYLENOL)  suppository 650 mg  650 mg Rectal Q6H PRN Mansy, Jan A, MD      . acyclovir (ZOVIRAX) 500 mg in dextrose 5 % 100 mL IVPB  10 mg/kg Intravenous Tobe Sos, MD 110 mL/hr at 06/19/20 1015 500 mg at 06/19/20 1015  . alum & mag hydroxide-simeth (MAALOX/MYLANTA) 200-200-20 MG/5ML suspension 30 mL  30 mL Oral TID AC & HS Mansy, Jan A, MD   30 mL at 06/19/20 1256  . bictegravir-emtricitabine-tenofovir AF (BIKTARVY) 50-200-25 MG per tablet 1 tablet  1 tablet Oral Daily Daiva Eves, Lisette Grinder, MD   1 tablet at 06/19/20 [phone]  . enoxaparin (LOVENOX) injection 40 mg  40 mg Subcutaneous Q24H Mansy, Jan A, MD   40 mg at 06/19/20 0523  . famotidine (PEPCID) tablet 20 mg  20 mg Oral BID Mansy, Jan A, MD   20 mg at 06/19/20 0820  . feeding supplement (ENSURE ENLIVE) (ENSURE ENLIVE) liquid 237 mL  237 mL Oral TID BM Marrion Coy, MD   237 mL at 06/19/20 1255  . ibuprofen (ADVIL) tablet 400 mg  400 mg Oral Q4H PRN Mansy, Jan A, MD   400 mg at 06/19/20 1252  . morphine 2 MG/ML injection 2 mg  2 mg Intravenous Q4H PRN Mansy, Vernetta Honey, MD      . Melene Muller ON 06/20/2020] multivitamin with minerals tablet 1 tablet  1 tablet Oral Daily Marrion Coy, MD      . ondansetron [facility]) tablet 4 mg  4 mg Oral Q6H PRN Mansy, Jan A, MD       Or  . ondansetron [facility]) injection 4 mg  4 mg Intravenous Q6H PRN Mansy, Jan A, MD   4 mg at 06/18/20 2258  . traZODone (DESYREL) tablet 25 mg  25 mg Oral QHS PRN Mansy, Vernetta Honey, MD         Abtx:  Anti-infectives (From admission, onward)   Start     Dose/Rate Route Frequency Ordered Stop   06/18/20 1445  bictegravir-emtricitabine-tenofovir AF (BIKTARVY) 50-200-25 MG per tablet 1 tablet     Discontinue     1 tablet Oral Daily 06/18/20 1435     06/18/20 0900  acyclovir (ZOVIRAX) 500 mg in dextrose 5 % 100 mL IVPB     Discontinue    Note to Pharmacy: meningitis   10 mg/kg  50.1 kg 110 mL/hr over 60 Minutes Intravenous Every 8 hours 06/18/20 0836     06/18/20 0330  cefTRIAXone (ROCEPHIN) 2 g in  sodium chloride 0.9 % 100 mL IVPB  Status:  Discontinued        2 g 200 mL/hr over 30 Minutes Intravenous  Every 12 hours 06/18/20 0201 06/18/20 0835   06/18/20 0045  ceFEPIme (MAXIPIME) 2 g in sodium chloride 0.9 % 100 mL IVPB        2 g 200 mL/hr over 30 Minutes Intravenous  Once 06/18/20 0031 06/18/20 0109   06/18/20 0045  metroNIDAZOLE (FLAGYL) IVPB 500 mg        500 mg 100 mL/hr over 60 Minutes Intravenous  Once 06/18/20 0031 06/18/20 0451   06/18/20 0045  vancomycin (VANCOCIN) IVPB 1000 mg/200 mL premix        1,000 mg 200 mL/hr over 60 Minutes Intravenous  Once 06/18/20 0031 06/18/20 0209      REVIEW OF SYSTEMS:  Const:  fever, negative chills, ++ weight loss Eyes: negative diplopia or visual changes, negative eye pain ENT: negative coryza, negative sore throat Resp: negative cough, hemoptysis, dyspnea Cards: negative for chest pain, palpitations, lower extremity edema GU: negative for frequency, dysuria and hematuria GI:++ abdominal pain, +diarrhea, - bleeding, constipation Skin: negative for rash and pruritus Heme: negative for easy bruising and gum/nose bleeding MS: generalized fatigue and weakness Neurolo:++ headaches, dizziness,  No rtigo, memory problems  Psych: + anxiety, depression  Endocrine: negative for thyroid, diabetes Allergy/Immunology- negative for any medication or food allergies Fully vaccinated for corona virus?  Objective:  VITALS:  BP (!) 111/53   Pulse (!) 101   Temp 99.1 F (37.3 C) (Oral)   Resp 13   Ht 5\' 6"  (1.676 m)   Wt 50.1 kg   SpO2 98%   BMI 17.82 kg/m  PHYSICAL EXAM:  General: Alert, cooperative, no distress, appears stated age.  Head: Normocephalic, without obvious abnormality, atraumatic. Eyes: Conjunctivae clear, anicteric sclerae. Pupils are equal ENT Nares normal. No drainage or sinus tenderness. Tongue coated Neck: some stiffness no carotid bruit and no JVD. Back: No CVA tenderness. Lungs: Clear to auscultation  bilaterally. No Wheezing or Rhonchi. No rales. Heart: Regular rate and rhythm, no murmur, rub or gallop. Abdomen: Soft, non-tender,not distended. Bowel sounds normal. No masses Extremities: atraumatic, no cyanosis. No edema. No clubbing Skin: No rashes or lesions. Or bruising Lymph: Cervical, supraclavicular normal. Neurologic: Grossly non-focal Pertinent Labs Lab Results CBC    Component Value Date/Time   WBC 3.3 (L) 06/19/2020 0517   RBC 4.22 06/19/2020 0517   HGB 12.9 (L) 06/19/2020 0517   HCT 37.4 (L) 06/19/2020 0517   PLT 243 06/19/2020 0517   MCV 88.6 06/19/2020 0517   MCH 30.6 06/19/2020 0517   MCHC 34.5 06/19/2020 0517   RDW 12.3 06/19/2020 0517   LYMPHSABS 0.8 06/19/2020 0517   MONOABS 0.2 06/19/2020 0517   EOSABS 0.0 06/19/2020 0517   BASOSABS 0.0 06/19/2020 0517    CMP Latest Ref Rng & Units 06/19/2020 06/18/2020 06/17/2020  Glucose 70 - 99 mg/dL 96 08/18/2020) 027(O)  BUN 6 - 20 mg/dL 9 11 17   Creatinine 0.61 - 1.24 mg/dL 536(U 4.40  Sodium 135 - 145 mmol/L 136 136 137  Potassium 3.5 - 5.1 mmol/L 4.1 4.0 4.3  Chloride 98 - 111 mmol/L 104 101 100  CO2 22 - 32 mmol/L 27 26 25   Calcium 8.9 - 10.3 mg/dL 7.6(L) 8.3(L) 8.9  Total Protein 6.5 - 8.1 g/dL - - -  Total Bilirubin 0.3 - 1.2 mg/dL - - -  Alkaline Phos 38 - 126 U/L - - -  AST 15 - 41 U/L - - -  ALT 0 - 44 U/L - - -      Microbiology: Recent  Results (from the past 240 hour(s))  Urine culture     Status: None   Collection Time: 06/17/20  7:36 PM   Specimen: Urine, Clean Catch  Result Value Ref Range Status   Specimen Description   Final    URINE, CLEAN CATCH Performed at [facility] [place] [facility], [address], [place], [place] [address]    Special Requests   Final    NONE Performed at [facility], [address] [place], [place] [address]    Culture   Final    NO GROWTH Performed at [facility], [address] [place], [place] [address]    Report Status 06/19/2020 FINAL  Final    Chlamydia/NGC rt PCR ([facility] only)     Status: None   Collection Time: 06/17/20  7:36 PM   Specimen: Urine  Result Value Ref Range Status   Specimen source GC/Chlam URINE, RANDOM  Final   Chlamydia Tr NOT DETECTED NOT DETECTED Final   N gonorrhoeae NOT DETECTED NOT DETECTED Final    Comment: (NOTE) This CT/NG assay has not been evaluated in patients with a history of  hysterectomy. Performed at [facility], [address] [place], [place] [address]   Group A Strep by PCR [facility] Only)     Status: None   Collection Time: 06/17/20  9:43 PM   Specimen: Throat; Sterile Swab  Result Value Ref Range Status   Group A Strep by PCR NOT DETECTED NOT DETECTED Final    Comment: Performed at [facility], [address] [place], [place] [address]  SARS Coronavirus 2 by RT PCR (hospital order, performed in [facility] hospital lab) Nasopharyngeal Throat     Status: None   Collection Time: 06/17/20  9:43 PM   Specimen: Throat; Nasopharyngeal  Result Value Ref Range Status   SARS Coronavirus 2 NEGATIVE NEGATIVE Final    Comment: (NOTE) SARS-CoV-2 target nucleic acids are NOT DETECTED.  The SARS-CoV-2 RNA is generally detectable in upper and lower respiratory specimens during the acute phase of infection. The lowest concentration of SARS-CoV-2 viral copies this assay can detect is 250 copies / mL. A negative result does not preclude SARS-CoV-2 infection and should not be used as the sole basis for treatment or other patient management decisions.  A negative result may occur with improper specimen collection / handling, submission of specimen other than nasopharyngeal swab, presence of viral mutation(s) within the areas targeted by this assay, and inadequate number of viral copies (<250 copies / mL). A negative result must be combined with clinical observations, patient history, and epidemiological information.  Fact Sheet for Patients:    BoilerBrush.com.cy  Fact Sheet for Healthcare Providers: https://pope.com/  This test is not yet approved or  cleared by the [place] FDA and has been authorized for detection and/or diagnosis of SARS-CoV-2 by FDA under an Emergency Use Authorization (EUA).  This EUA will remain in effect (meaning this test can be used) for the duration of the COVID-19 declaration under Section 564(b)(1) of the Act, 21 U.S.C. section 360bbb-3(b)(1), unless the authorization is terminated or revoked sooner.  Performed at [facility], [address] [place], [place] [address]   Blood culture (routine x 2)     Status: None (Preliminary result)   Collection Time: 06/17/20 10:36 PM   Specimen: BLOOD LEFT FOREARM  Result Value Ref Range Status   Specimen Description BLOOD LEFT FOREARM  Final   Special Requests   Final  BOTTLES DRAWN AEROBIC AND ANAEROBIC Blood Culture adequate volume   Culture   Final    NO GROWTH 2 DAYS Performed at [facility], [address] [place], [place] [address]    Report Status PENDING  Incomplete  Blood culture (routine x 2)     Status: None (Preliminary result)   Collection Time: 06/17/20 10:36 PM   Specimen: Left Antecubital; Blood  Result Value Ref Range Status   Specimen Description LEFT ANTECUBITAL  Final   Special Requests   Final    BOTTLES DRAWN AEROBIC AND ANAEROBIC Blood Culture adequate volume   Culture   Final    NO GROWTH 2 DAYS Performed at [facility], [address] [place], [place] [address]    Report Status PENDING  Incomplete  Urine culture     Status: None   Collection Time: 06/17/20 10:52 PM   Specimen: Urine, Clean Catch  Result Value Ref Range Status   Specimen Description   Final    URINE, CLEAN CATCH Performed at [facility], [address] [place], [place] [address]    Special Requests   Final    NONE Performed at [facility], [address] [place], [place] [address]    Culture   Final    NO GROWTH Performed at [facility], [address], [place], [place] [address]    Report Status 06/19/2020 FINAL  Final    IMAGING RESULTS:  I have personally reviewed the films ? Impression/Recommendation ?Admitted with fever, headache, fatigue and GI symptoms HIV P24 reactive Could be acute HIV infection. But need to make sure he dose not have advanced HIV disease and an opportunistic infection Awaiting other test results Will also check for EBV/CMV especially with fever, transaminitis  Aseptic meningitis- Crypto neg, RPR NR Awaiting HSV DNA ( added today) Continue acyclovir for now . 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 ? ___________________________________________________ Discussed with patient in detail Note:  This document was prepared using Dragon voice recognition software and may include unintentional dictation errors.

## 2020-06-20 ENCOUNTER — Other Ambulatory Visit: Payer: Self-pay | Admitting: Infectious Diseases

## 2020-06-20 DIAGNOSIS — R7401 Elevation of levels of liver transaminase levels: Secondary | ICD-10-CM

## 2020-06-20 DIAGNOSIS — B159 Hepatitis A without hepatic coma: Secondary | ICD-10-CM

## 2020-06-20 DIAGNOSIS — E43 Unspecified severe protein-calorie malnutrition: Secondary | ICD-10-CM

## 2020-06-20 LAB — HEPATITIS B SURFACE ANTIBODY, QUANTITATIVE: Hep B S AB Quant (Post): 628.4 m[IU]/mL (ref >9.9)

## 2020-06-20 LAB — VDRL, CSF: VDRL Quant, CSF: NONREACTIVE

## 2020-06-20 LAB — HEPATITIS A ANTIBODY, IGM: Hep A IgM: NONREACTIVE

## 2020-06-20 MED ORDER — BIKTARVY 50-200-25 MG PO TABS: 1.0000 | ORAL_TABLET | Freq: Every day | ORAL | 0 refills | Status: DC

## 2020-06-20 MED ORDER — DEXTROSE 5 % IV SOLN
10.0000 mg/kg | Freq: Three times a day (TID) | INTRAVENOUS | Status: DC
  Administered 2020-06-20 (×3): 500 mg via INTRAVENOUS
  Filled 2020-06-20 (×7): qty 10

## 2020-06-20 NOTE — Progress Notes (Signed)
PROGRESS NOTE    Juan Vaughn  EVO:[id number] DOB: 2001-06-27 DOA: 06/17/2020 PCP: Donnie Coffin, MD   Chief complaint.  High fever. Brief Narrative:  Patient is a 19 year old male came into the emergency room with sudden onset of fever chills,nausea, photophobia and severe headache. He had a significant nausea vomiting a few days back and he came to the emergency room, symptom has improved since that time. He did not have any diarrhea. Patient has a risk factor for HIV, but never been tested or treated before.  Upon arriving the emergency room, he had LP performed, showed a glucose 50, RBC 12, WBC 59 and protein 31. He was treated with broad-spectrum antibiotics. His HIV screening antibody was negative, P 24 antigen positive.  7/4.Discussed with infectious disease Dr. Bill Salinas over test resultsand patient presentation.Orderedviral load and a CD4 cell count. His impression is that patient has acute HIV syndrome, presented withviral meningitis. Antibiotic discontinued. Acyclovir started. Also ordered cryptococcus antigen as well as HSV antigen. RPR also ordered.  7/5.  Cryptococcus antigen was negative.  Started Biktarvy yesterday by infectious disease  7/6.  Still pending HSV PCR and the confirmation of HIV.  Spoke with infect disease, will keep patient for 1 more day.    Assessment & Plan:   Principal Problem:   Aseptic meningitis Active Problems:   Pneumonia   Acute HIV infection (HCC)   Protein-calorie malnutrition, severe   #1.  Aseptic meningitis. Most likely secondary to acute enteroviral syndrome.  Still pending for HSV PCR.  Continue IV acyclovir.  2.  Acute retroviral syndrome. Started antiviral treatment by ID.  Final confirmation of HIV status still pending.  3.  Sepsis. Condition stable.  4.  Liver function changes. Pending CMV and EBV virus tests.   DVT prophylaxis:SCDs Code Status:Full Family Communication:None Disposition  Plan:  Patient came from:Home  Anticipated d/c place:Home  Barriers to d/c OR conditions which need to be met to effect a safe d/c:  Planning discharge home tomorrow. Consultants:  ID  Procedures:LP Antimicrobials: Acyclovir.   Subjective: Patient is spiked a fever again today.  Still has some headaches, but overall had improved. No nausea vomiting or diarrhea.  No abdominal pain.  No short of breath or cough.  Objective: Vitals:   06/19/20 1253 06/19/20 1541 06/20/20 0741 06/20/20 1119  BP: (!) 111/53 127/73 123/75 118/63  Pulse:   (!) 101 100  Resp: _0 Temp:   99.1 F (37.3 C) (!) 101.1 F (38.4 C)  TempSrc:  Oral Oral Oral  SpO2:   99% 99%  Weight:      Height:        Intake/Output Summary (Last 24 hours) at 06/20/2020 1459 Last data filed at 06/20/2020 1334 Gross per 24 hour  Intake 240 ml  Output 3825 ml  Net -3585 ml   Filed Weights   06/17/20 1913 06/18/20 0250  Weight: 48.5 kg 50.1 kg    Examination:  General exam: Appears calm and comfortable  Respiratory system: Clear to auscultation. Respiratory effort normal. Cardiovascular system: S1 & S2 heard, RRR. No JVD, murmurs, rubs, gallops or clicks. No pedal edema. Gastrointestinal system: Abdomen is nondistended, soft and nontender. No organomegaly or masses felt. Normal bowel sounds heard. Central nervous system: Alert and oriented. No focal neurological deficits. Extremities: Symmetric 5 x 5 power. Skin: No rashes, lesions or ulcers Psychiatry: Judgement and insight appear normal. Mood & affect appropriate.     Data Reviewed: I have personally reviewed  following labs and imaging studies  CBC: Recent Labs  Lab 06/17/20 1936 06/18/20 0626 06/19/20 0517  WBC 6.2 5.2 3.3*  NEUTROABS  --   --  2.2  HGB 15.2 14.9 12.9*  HCT 44.1 44.1 37.4*  MCV 88.4 91.1 88.6   PLT 286 281 224   Basic Metabolic Panel: Recent Labs  Lab 06/17/20 1936 06/18/20 0626 06/19/20 0517  NA 137 136 136  K 4.3 4.0 4.1  CL 100 101 104  CO2 _0 GLUCOSE 100* 102* 96  BUN _1 CREATININE 1.21 1.05 0.93  CALCIUM 8.9 8.3* 7.6*  MG  --   --  1.9   GFR: Estimated Creatinine Clearance: 90.5 mL/min (by C-G formula based on SCr of 0.93 mg/dL). Liver Function Tests: Recent Labs  Lab 06/19/20 1405  AST 139*  ALT 125*  ALKPHOS 33*  BILITOT 0.6  PROT 5.7*  ALBUMIN 3.2*   No results for input(s): LIPASE, AMYLASE in the last 168 hours. No results for input(s): AMMONIA in the last 168 hours. Coagulation Profile: No results for input(s): INR, PROTIME in the last 168 hours. Cardiac Enzymes: No results for input(s): CKTOTAL, CKMB, CKMBINDEX, TROPONINI in the last 168 hours. BNP (last 3 results) No results for input(s): PROBNP in the last 8760 hours. HbA1C: No results for input(s): HGBA1C in the last 72 hours. CBG: Recent Labs  Lab 06/17/20 1934  GLUCAP 90   Lipid Profile: No results for input(s): CHOL, HDL, LDLCALC, TRIG, CHOLHDL, LDLDIRECT in the last 72 hours. Thyroid Function Tests: No results for input(s): TSH, T4TOTAL, FREET4, T3FREE, THYROIDAB in the last 72 hours. Anemia Panel: No results for input(s): VITAMINB12, FOLATE, FERRITIN, TIBC, IRON, RETICCTPCT in the last 72 hours. Sepsis Labs: Recent Labs  Lab 06/17/20 2236  LATICACIDVEN 2.1*    Recent Results (from the past 240 hour(s))  Urine culture     Status: None   Collection Time: 06/17/20  7:36 PM   Specimen: Urine, Clean Catch  Result Value Ref Range Status   Specimen Description   Final    URINE, CLEAN CATCH Performed at [facility], [address], [place], [place] [address]    Special Requests   Final    NONE Performed at [facility], [address], [place], [place] [address]    Culture   Final    NO GROWTH Performed at [facility], [place] [address] [place], [place] [address]    Report Status 06/19/2020 FINAL  Final  Chlamydia/NGC rt PCR ([place] only)     Status: None   Collection Time: 06/17/20  7:36 PM   Specimen: Urine  Result Value Ref Range Status   Specimen source GC/Chlam URINE, RANDOM  Final   Chlamydia Tr NOT DETECTED NOT DETECTED Final   N gonorrhoeae NOT DETECTED NOT DETECTED Final    Comment: (NOTE) This CT/NG assay has not been evaluated in patients with a history of  hysterectomy. Performed at [facility], [place], [place] [address]   Group A Strep by PCR [facility] Only)     Status: None   Collection Time: 06/17/20  9:43 PM   Specimen: Throat; Sterile Swab  Result Value Ref Range Status   Group A Strep by PCR NOT DETECTED NOT DETECTED Final    Comment: Performed at [facility], [address] [place], [place] [address]  SARS Coronavirus 2 by RT PCR (hospital order, performed in [facility] hospital lab)  Nasopharyngeal Throat     Status: None   Collection Time: 06/17/20  9:43 PM   Specimen: Throat; Nasopharyngeal  Result Value Ref Range Status   SARS Coronavirus 2 NEGATIVE NEGATIVE Final    Comment: (NOTE) SARS-CoV-2 target nucleic acids are NOT DETECTED.  The SARS-CoV-2 RNA is generally detectable in upper and lower respiratory specimens during the acute phase of infection. The lowest concentration of SARS-CoV-2 viral copies this assay can detect is 250 copies / mL. A negative result does not preclude SARS-CoV-2 infection and should not be used as the sole basis for treatment or other patient management decisions.  A negative result may occur with improper specimen collection / handling, submission of specimen other than nasopharyngeal swab, presence of viral mutation(s) within the areas targeted by this assay, and inadequate number of viral copies (<250 copies / mL). A negative result must be combined with clinical observations, patient history, and epidemiological  information.  Fact Sheet for Patients:   StrictlyIdeas.no  Fact Sheet for Healthcare Providers: BankingDealers.co.za  This test is not yet approved or  cleared by the [place] FDA and has been authorized for detection and/or diagnosis of SARS-CoV-2 by FDA under an Emergency Use Authorization (EUA).  This EUA will remain in effect (meaning this test can be used) for the duration of the COVID-19 declaration under Section 564(b)(1) of the Act, 21 U.S.C. section 360bbb-3(b)(1), unless the authorization is terminated or revoked sooner.  Performed at [facility], [address] [place], [place] [address]   Blood culture (routine x 2)     Status: None (Preliminary result)   Collection Time: 06/17/20 10:36 PM   Specimen: BLOOD LEFT FOREARM  Result Value Ref Range Status   Specimen Description BLOOD LEFT FOREARM  Final   Special Requests   Final    BOTTLES DRAWN AEROBIC AND ANAEROBIC Blood Culture adequate volume   Culture   Final    NO GROWTH 3 DAYS Performed at [facility], [address] [place], [place] [address]    Report Status PENDING  Incomplete  Blood culture (routine x 2)     Status: None (Preliminary result)   Collection Time: 06/17/20 10:36 PM   Specimen: Left Antecubital; Blood  Result Value Ref Range Status   Specimen Description LEFT ANTECUBITAL  Final   Special Requests   Final    BOTTLES DRAWN AEROBIC AND ANAEROBIC Blood Culture adequate volume   Culture   Final    NO GROWTH 3 DAYS Performed at [facility], [address], [place], [place] [address]    Report Status PENDING  Incomplete  Urine culture     Status: None   Collection Time: 06/17/20 10:52 PM   Specimen: Urine, Clean Catch  Result Value Ref Range Status   Specimen Description   Final    URINE, CLEAN CATCH Performed at [facility], [address] [place], [place] [address]    Special Requests   Final      NONE Performed at [facility], [address], [place], [place] [address]    Culture   Final    NO GROWTH Performed at [facility], [place] [address] [place], [place] [address]    Report Status 06/19/2020 FINAL  Final         Radiology Studies: No results found.      Scheduled Meds: . alum & mag hydroxide-simeth  30 mL Oral TID AC & HS  . bictegravir-emtricitabine-tenofovir AF  1  tablet Oral Daily  . enoxaparin (LOVENOX) injection  40 mg Subcutaneous Q24H  . famotidine  20 mg Oral BID  . feeding supplement (ENSURE ENLIVE)  237 mL Oral TID BM  . multivitamin with minerals  1 tablet Oral Daily   Continuous Infusions: . sodium chloride Stopped (06/18/20 0906)  . acyclovir 500 mg (06/20/20 0851)     LOS: 2 days    Time spent: 26 minutes    Sharen Hones, MD [facility]   To contact the attending provider between 7A-7P or the covering provider during after hours 7P-7A, please log into the web site [email or web address] and access using universal [place] password for that web site. If you do not have the password, please call the hospital operator.  06/20/2020, 2:59 PM

## 2020-06-20 NOTE — Progress Notes (Signed)
ID Pt feeling better Headache he says is controlled with tylenol Had a temp of 101.1 but he thinks it was because of the temp in the room appetite improving  Patient Vitals for the past 24 hrs:  BP Temp Temp src Pulse Resp SpO2  06/20/20 1529 107/68 98.7 F (37.1 C) Oral 87 17 100 %  06/20/20 1119 118/63 (!) 101.1 F (38.4 C) Oral 100 20 99 %  06/20/20 0741 123/75 99.1 F (37.3 C) Oral (!) 101 20 99 %   Awake and alert No neck rigidity Chest CTA HSs1s2 Abd soft CNS non focal   CBC Latest Ref Rng & Units 06/19/2020 06/18/2020 06/17/2020  WBC 4.0 - 10.5 K/uL 3.3(L) 5.2 6.2  Hemoglobin 13.0 - 17.0 g/dL 12.9(L) 14.9 15.2  Hematocrit 39 - 52 % 37.4(L) 44.1 44.1  Platelets 150 - 400 K/uL 243 281 286    CMP Latest Ref Rng & Units 06/19/2020 06/18/2020 06/17/2020  Glucose 70 - 99 mg/dL 96 151(V) 616(W)  BUN 6 - 20 mg/dL 9 11 17   Creatinine 0.61 - 1.24 mg/dL 7.37 1.06  Sodium 135 - 145 mmol/L 136 136 137  Potassium 3.5 - 5.1 mmol/L 4.1 4.0 4.3  Chloride 98 - 111 mmol/L 104 101 100  CO2 22 - 32 mmol/L 27 26 25   Calcium 8.9 - 10.3 mg/dL 7.6(L) 8.3(L) 8.9  Total Protein 6.5 - 8.1 g/dL 2.69) - -  Total Bilirubin 0.3 - 1.2 mg/dL 0.6 - -  Alkaline Phos 38 - 126 U/L 33(L) - -  AST 15 - 41 U/L 139(H) - -  ALT 0 - 44 U/L 125(H) - -   Hepatitis A total antibody positive But igM negative Hepatitis B surface antibody is 628 indicative of vaccination RPR NR Crypto NR HIV p24 antigen positive HIV RNA -pending cd4 pending toxo pending CMV/EBV pending HSV DNA in CSF pending   Impression/recommendation  ?Admitted with fever, headache, fatigue and GI symptoms HIV P24 reactive Could be acute HIV infection. Wait for HIV RNA to confirm the diagnosis - is on Biktarvy-   Transaminitis- could be from acute HIV  Also sent CMV/EBV antibodies Hepatitis A is an old infection ( IgM negative)  Aseptic meningitis- Crypto neg, RPR NR Awaiting HSV DNA  Continue acyclovir for now . He does not  have any evidence of herpes  encephalitis   Leucopenia- due to HIV infection  Pt says he would benefit from counseling as OP He will follow up at [facility] with Dr.[place]? appt will have to be made before he his discharged

## 2020-06-21 LAB — CBC WITH DIFFERENTIAL/PLATELET
Abs Immature Granulocytes: 0.02 10*3/uL (ref 0.00–0.07)
Basophils Absolute: 0 10*3/uL (ref 0.0–0.1)
Basophils Relative: 1 %
Eosinophils Absolute: 0 10*3/uL (ref 0.0–0.5)
Eosinophils Relative: 0 %
HCT: 41 % (ref 39.0–52.0)
Hemoglobin: 14.6 g/dL (ref 13.0–17.0)
Immature Granulocytes: 1 %
Lymphocytes Relative: 58 %
Lymphs Abs: 2.4 10*3/uL (ref 0.7–4.0)
MCH: 31.6 pg (ref 26.0–34.0)
MCHC: 35.6 g/dL (ref 30.0–36.0)
MCV: 88.7 fL (ref 80.0–100.0)
Monocytes Absolute: 0.2 10*3/uL (ref 0.1–1.0)
Monocytes Relative: 6 %
Neutro Abs: 1.4 10*3/uL — ABNORMAL LOW (ref 1.7–7.7)
Neutrophils Relative %: 34 %
Platelets: 211 10*3/uL (ref 150–400)
RBC: 4.62 MIL/uL (ref 4.22–5.81)
RDW: 12.3 % (ref 11.5–15.5)
Smear Review: NORMAL
WBC: 4 10*3/uL (ref 4.0–10.5)
nRBC: 0 % (ref 0.0–0.2)

## 2020-06-21 LAB — HELPER T-LYMPH-CD4 (ARMC ONLY)
% CD 4 Pos. Lymph.: 27.7 % — ABNORMAL LOW (ref 30.8–58.5)
Absolute CD 4 Helper: UNDETERMINED /uL

## 2020-06-21 LAB — EPSTEIN-BARR VIRUS (EBV) ANTIBODY PROFILE
EBV NA IgG: 104 U/mL — ABNORMAL HIGH (ref 0.0–17.9)
EBV VCA IgG: 155 U/mL — ABNORMAL HIGH (ref 0.0–17.9)
EBV VCA IgM: <36 U/mL (ref 0.0–35.9)

## 2020-06-21 LAB — BASIC METABOLIC PANEL
Anion gap: 9 (ref 5–15)
BUN: 12 mg/dL (ref 6–20)
CO2: 27 mmol/L (ref 22–32)
Calcium: 8.6 mg/dL — ABNORMAL LOW (ref 8.9–10.3)
Chloride: 98 mmol/L (ref 98–111)
Creatinine, Ser: 0.79 mg/dL (ref 0.61–1.24)
GFR calc Af Amer: >60 mL/min (ref >60)
GFR calc non Af Amer: >60 mL/min (ref >60)
Glucose, Bld: 104 mg/dL — ABNORMAL HIGH (ref 70–99)
Potassium: 4 mmol/L (ref 3.5–5.1)
Sodium: 134 mmol/L — ABNORMAL LOW (ref 135–145)

## 2020-06-21 LAB — HIV-1 RNA QUANT-NO REFLEX-BLD
HIV 1 RNA Quant: >10000000 copies/mL
LOG10 HIV-1 RNA: UNDETERMINED log10copy/mL

## 2020-06-21 LAB — CMV ANTIBODY, IGG (EIA): CMV Ab - IgG: 3.1 U/mL — ABNORMAL HIGH (ref 0.00–0.59)

## 2020-06-21 LAB — CMV IGM: CMV IgM: <30 AU/mL (ref 0.0–29.9)

## 2020-06-21 LAB — TOXOPLASMA ANTIBODIES- IGG AND  IGM
Toxoplasma Antibody- IgM: <3 AU/mL (ref 0.0–7.9)
Toxoplasma IgG Ratio: <3 IU/mL (ref 0.0–7.1)

## 2020-06-21 LAB — CRYPTOCOCCUS ANTIGEN, SERUM: Cryptococcus Antigen, Serum: NEGATIVE

## 2020-06-21 LAB — MAGNESIUM: Magnesium: 2.2 mg/dL (ref 1.7–2.4)

## 2020-06-21 MED ORDER — VALACYCLOVIR HCL 1 G PO TABS: 1000.0000 mg | ORAL_TABLET | Freq: Three times a day (TID) | ORAL | 0 refills | Status: AC

## 2020-06-21 MED ORDER — ACETAMINOPHEN 325 MG PO TABS: 650.0000 mg | ORAL_TABLET | Freq: Four times a day (QID) | ORAL | 0 refills | Status: AC | PRN

## 2020-06-21 MED ORDER — VALACYCLOVIR HCL 500 MG PO TABS: 1000.0000 mg | ORAL_TABLET | Freq: Three times a day (TID) | ORAL | Status: DC

## 2020-06-21 MED ORDER — VALACYCLOVIR HCL 500 MG PO TABS
1000.0000 mg | ORAL_TABLET | Freq: Three times a day (TID) | ORAL | Status: DC
  Administered 2020-06-21: 1000 mg via ORAL
  Filled 2020-06-21 (×2): qty 2

## 2020-06-21 MED ORDER — IBUPROFEN 400 MG PO TABS: 400.0000 mg | ORAL_TABLET | ORAL | 0 refills | Status: DC | PRN

## 2020-06-21 NOTE — Progress Notes (Signed)
Pt was discharged at 1830. Discharge paperwork was reviewed with patient. Pt was receptive and verbalized understanding. Pt was picked up by friend. Pt was escorted off the unit by volunteer via wheelchair. Pt was encouraged  to pick up prescriptions from pharmacy listed in AVS prior to going home.

## 2020-06-21 NOTE — Progress Notes (Signed)
ID Pt has headache No fever   O/e in some distress due to headache  I took a throat swab for GC/CHl and he gagged and vomited Will not do rectal swab for GC/CHl now- can be done as OP with [facility] CNS- non focal  CBC Latest Ref Rng & Units 06/21/2020 06/19/2020 06/18/2020  WBC 4.0 - 10.5 K/uL 4.0 3.3(L) 5.2  Hemoglobin 13.0 - 17.0 g/dL 38.1 12.9(L) 14.9  Hematocrit 39 - 52 % 41.0 37.4(L) 44.1  Platelets 150 - 400 K/uL 211 243 281    CMP Latest Ref Rng & Units 06/21/2020 06/19/2020 06/18/2020  Glucose 70 - 99 mg/dL 771(H) 96 657(X)  BUN 6 - 20 mg/dL 12 9 11   Creatinine 0.61 - 1.24 mg/dL 0.38 3.33  Sodium 135 - 145 mmol/L 134(L) 136 136  Potassium 3.5 - 5.1 mmol/L 4.0 4.1 4.0  Chloride 98 - 111 mmol/L 98 104 101  CO2 22 - 32 mmol/L 27 27 26   Calcium 8.9 - 10.3 mg/dL 8.32) 7.6(L) 8.3(L)  Total Protein 6.5 - 8.1 g/dL - 5.7(L) -  Total Bilirubin 0.3 - 1.2 mg/dL - 0.6 -  Alkaline Phos 38 - 126 U/L - 33(L) -  AST 15 - 41 U/L - 139(H) -  ALT 0 - 44 U/L - 125(H) -    Impression/recommendation  Acute HIV infection with aseptic meningitis HIV Vl > 10 million copies very high Started on [place]( told him how to take it and avoid taking with vitamins, antacids) Csf HSV DNA pending On acyclovir which can be changed to valtrex for discharge If DNA neg I can call him and stop the valtrex Cd4, genosure prime, 9.1(B all sent and pending Crypto, RPR neg toxo sent as well  CMV IgG positive Sent GC/CHl throat swab Rectal swab will be done as OP ( see above)  Follow up appt with [facility] on 07/06/20 at 2pm Sent a prescription for 30 day Biktarvy to his pharmacy. Gave him my contact info [phone] if he has any concerns EBV

## 2020-06-21 NOTE — Discharge Summary (Signed)
Physician Discharge Summary [facility]    Patient: Juan Vaughn                   Admit date: 06/17/2020   DOB: 08/27/2001             Discharge date:06/21/2020/1:02 PM [id number]                          PCP: Emogene MorganAycock, Ngwe A, MD  Disposition: HOME  Recommendations for Outpatient Follow-up:   Follow up Dr. Drue SecondSnider at [facility] . Follow up appt with [facility] on 07/06/20 at 2pm  Discharge Condition: Stable   Code Status:   Code Status: Full Code  Diet recommendation: Regular healthy diet   Discharge Diagnoses:    Principal Problem:   Aseptic meningitis Active Problems:   Pneumonia   Acute HIV infection (HCC)   Protein-calorie malnutrition, severe   History of Present Illness/ Hospital Course Juan Vaughn/Brief Summary:   Patient is a 19 year old male came into the emergency room with sudden onset of fever chills,nausea, photophobia and severe headache. He had a significant nausea vomiting a few days back and he came to the emergency room, symptom has improved since that time. He did not have any diarrhea. Patient has a risk factor for HIV, but never been tested or treated before.  Upon arriving the emergency room, he had LP performed, showed a glucose 50, RBC 12, WBC 59 and protein 31. He was treated with broad-spectrum antibiotics. His HIV screening antibody was negative, P 24 antigen positive.  7/4.Discussed with infectious disease Dr. Oren SectionVandam,went over test resultsand patient presentation.Orderedviral load and a CD4 cell count. His impression is that patient has acute HIV syndrome, presented withviral meningitis. Antibiotic discontinued. Acyclovir started. Also ordered cryptococcus antigen as well as HSV antigen. RPR also ordered.  7/5.Cryptococcus antigen was negative. Started Biktarvyyesterday by infectious disease  7/6..7/7   pending HSV PCR. Confirmation of HIV.  Spoke with infect disease, IV acyclovir switch to p.o. Valtrex ID recommended 7  more days, patient started on Biktarvy Recommended to [facility] Appointment confirmed Jul 06, 2020 at 2 PM    Sepsis Resolved  Acute HIV infection with aseptic meningitis HIV Vl > 10 million copies very high Started on [place]iktarvy( told him how to take it and avoid taking with vitamins, antacids) Csf HSV DNA pending On acyclovir which can be changed to valtrex for discharge If DNA neg I can call him and stop the valtrex Cd4, genosure prime, [id number] all sent and pending Crypto, RPR neg toxo sent as well  CMV IgG positive Sent GC/CHl throat swab Rectal swab will be done as OP ( see above)  Follow up appt with [facility] on 07/06/20 at 2pm Sent a prescription for 30 day Biktarvy to his pharmacy. Gave him my contact info [phone] if he has any concerns  Liver function changes. Pending CMV and EBV virus tests.     Nutritional status:  Nutrition Problem: Severe Malnutrition Etiology: acute illness Signs/Symptoms: percent weight loss, mild fat depletion, moderate muscle depletion, severe muscle depletion Percent weight loss: 12 % Interventions: Ensure Enlive (each supplement provides 350kcal and 20 grams of protein), MVI, Liberalize Diet   Discharge Instructions:   Discharge Instructions    Activity as tolerated - No restrictions   Complete by: As directed    Call MD for:  hives   Complete by: As directed  Call MD for:  temperature >100.4   Complete by: As directed    Diet - low sodium heart healthy   Complete by: As directed    Discharge instructions   Complete by: As directed    Acute HIV infection with aseptic meningitis HIV Vl > 10 million copies very high Started on Biktarvy( avoid taking with vitamins, antacids)   Follow up appt with [facility] on 07/06/20 at 2pm Sent a prescription for 30 day Biktarvy to his pharmacy. Call ID ---contact info [phone] if any concerns   Increase activity slowly   Complete by:  As directed        Medication List    STOP taking these medications   aluminum-magnesium hydroxide-simethicone 200-200-20 MG/5ML Susp Commonly known as: MAALOX   famotidine 20 MG tablet Commonly known as: PEPCID   ondansetron 4 MG disintegrating tablet Commonly known as: Zofran ODT     TAKE these medications   acetaminophen 325 MG tablet Commonly known as: TYLENOL Take 2 tablets (650 mg total) by mouth every 6 (six) hours as needed for mild pain (or Fever >/= 101).   Biktarvy 50-200-25 MG Tabs tablet Generic drug: bictegravir-emtricitabine-tenofovir AF Take 1 tablet by mouth daily.   ibuprofen 400 MG tablet Commonly known as: ADVIL Take 1 tablet (400 mg total) by mouth every 4 (four) hours as needed for fever.   Procto-Med HC 2.5 % rectal cream Generic drug: hydrocortisone Apply topically.   promethazine 25 MG tablet Commonly known as: PHENERGAN Take 25 mg by mouth every 6 (six) hours as needed.   sodium fluoride 1.1 % Gel dental gel Commonly known as: FLUORISHIELD SMARTSIG:Sparingly By Mouth Every Night   valACYclovir 1000 MG tablet Commonly known as: VALTREX Take 1 tablet (1,000 mg total) by mouth 3 (three) times daily for 7 days.       Follow-up Information    Aycock, Ngwe A, MD Follow up in 1 week(s).   Specialty: Family Medicine Contact information: [address] [place] [address] [phone]        von Dewaine Oats Follow up in 3 week(s).        Lynn Ito, MD Follow up in 1 week(s).   Specialty: Infectious Diseases Contact information: [address] [place] [address] [phone]              No Known Allergies   Procedures /Studies:   DG Chest 1 View  Result Date: 06/17/2020 CLINICAL DATA:  19 year old male with fever. EXAM: CHEST  1 VIEW COMPARISON:  None. FINDINGS: The heart size and mediastinal contours are within normal limits. Both lungs are clear. The visualized skeletal structures  are unremarkable. IMPRESSION: No active disease. Electronically Signed   By: Elgie Collard M.D.   On: 06/17/2020 22:13   CT Head Wo Contrast  Result Date: 06/18/2020 CLINICAL DATA:  Headache, dizziness EXAM: CT HEAD WITHOUT CONTRAST TECHNIQUE: Contiguous axial images were obtained from the base of the skull through the vertex without intravenous contrast. COMPARISON:  None FINDINGS: Brain: Some beam hardening along the calvarial results in artifactual hypoattenuation along the cerebral convexities. No convincing CT evidence of acute infarction, hemorrhage, hydrocephalus, extra-axial collection or mass lesion/mass effect. Benign dural calcifications. Midline intracranial structures are normal. Cerebellar tonsils are normally positioned. Vascular: No hyperdense vessel or unexpected calcification. Skull: No calvarial fracture or suspicious osseous lesion. No scalp swelling or hematoma. Sinuses/Orbits: Paranasal sinuses and mastoid air cells are predominantly clear. Included orbital structures are unremarkable. Other: None IMPRESSION:  No acute intracranial abnormality. Electronically Signed   By: Kreg Shropshire M.D.   On: 06/18/2020 00:52   CT ABDOMEN PELVIS W CONTRAST  Result Date: 06/11/2020 CLINICAL DATA:  Pt comes via POV from home with c/o N/V/D, abdominal pain. Pt states he hasn't been able to tolerate anything PO. Pt states RLQ pain. EXAM: CT ABDOMEN AND PELVIS WITH CONTRAST TECHNIQUE: Multidetector CT imaging of the abdomen and pelvis was performed using the standard protocol following bolus administration of intravenous contrast. CONTRAST:  63mL OMNIPAQUE IOHEXOL 300 MG/ML  SOLN COMPARISON:  None. FINDINGS: Lower chest: Clear lung bases.  Heart normal in size. Hepatobiliary: No focal liver abnormality is seen. No gallstones, gallbladder wall thickening, or biliary dilatation. Pancreas: Unremarkable. No pancreatic ductal dilatation or surrounding inflammatory changes. Spleen: Normal in size without focal  abnormality. Adrenals/Urinary Tract: Adrenal glands are unremarkable. Kidneys are normal, without renal calculi, focal lesion, or hydronephrosis. Bladder is unremarkable. Stomach/Bowel: On the coronal and sagittal views, the normal appendix is suggested. There are no findings to suggest acute appendicitis. Normal stomach. Small bowel and colon are normal in caliber. No wall thickening. No bowel inflammation. Vascular/Lymphatic: No significant vascular findings are present. No enlarged abdominal or pelvic lymph nodes. Reproductive: Uterus and bilateral adnexa are unremarkable. Other: No abdominal wall hernia or abnormality. No abdominopelvic ascites. Musculoskeletal: No acute or significant osseous findings. IMPRESSION: Normal enhanced CT scan of the abdomen pelvis. Electronically Signed   By: Amie Portland M.D.   On: 06/11/2020 12:17     Subjective:   Patient was seen and examined 06/21/2020, 1:02 PM Patient stable today. No acute distress.  No issues overnight Stable for discharge.  Discharge Exam:    Vitals:   06/20/20 1942 06/21/20 0409 06/21/20 0728 06/21/20 1150  BP: 108/69 115/77 117/79 113/72  Pulse: 83 85 89 (!) 108  Resp: 20 20 18 17   Temp: 98.5 F (36.9 C) 97.9 F (36.6 C) 98.7 F (37.1 C) 97.7 F (36.5 C)  TempSrc:   Oral   SpO2: 100% 98% 99% 100%  Weight:      Height:        General: Pt lying comfortably in bed & appears in no obvious distress. Cardiovascular: S1 & S2 heard, RRR, S1/S2 +. No murmurs, rubs, gallops or clicks. No JVD or pedal edema. Respiratory: Clear to auscultation without wheezing, rhonchi or crackles. No increased work of breathing. Abdominal:  Non-distended, non-tender & soft. No organomegaly or masses appreciated. Normal bowel sounds heard. CNS: Alert and oriented. No focal deficits. Extremities: no edema, no cyanosis    The results of significant diagnostics from this hospitalization (including imaging, microbiology, ancillary and laboratory) are  listed below for reference.      Microbiology:   Recent Results (from the past 240 hour(s))  Urine culture     Status: None   Collection Time: 06/17/20  7:36 PM   Specimen: Urine, Clean Catch  Result Value Ref Range Status   Specimen Description   Final    URINE, CLEAN CATCH Performed at [facility], [address] [place], [place]    Special Requests   Final    NONE Performed at [facility], [address], [place], [place]    Culture   Final    NO GROWTH Performed at [facility], [address], [place], [place]    Report Status 06/19/2020 FINAL  Final  Chlamydia/NGC rt PCR ([facility] only)     Status: None  Collection Time: 06/17/20  7:36 PM   Specimen: Urine  Result Value Ref Range Status   Specimen source GC/Chlam URINE, RANDOM  Final   Chlamydia Tr NOT DETECTED NOT DETECTED Final   N gonorrhoeae NOT DETECTED NOT DETECTED Final    Comment: (NOTE) This CT/NG assay has not been evaluated in patients with a history of  hysterectomy. Performed at [facility], [address] [place], [place] [address]   Group A Strep by PCR [facility] Only)     Status: None   Collection Time: 06/17/20  9:43 PM   Specimen: Throat; Sterile Swab  Result Value Ref Range Status   Group A Strep by PCR NOT DETECTED NOT DETECTED Final    Comment: Performed at [facility], [address] [place], [place] [address]  SARS Coronavirus 2 by RT PCR (hospital order, performed in [facility] hospital lab) Nasopharyngeal Throat     Status: None   Collection Time: 06/17/20  9:43 PM   Specimen: Throat; Nasopharyngeal  Result Value Ref Range Status   SARS Coronavirus 2 NEGATIVE NEGATIVE Final    Comment: (NOTE) SARS-CoV-2 target nucleic acids are NOT DETECTED.  The SARS-CoV-2 RNA is generally detectable in upper and lower respiratory specimens during the acute phase of infection. The lowest concentration of SARS-CoV-2 viral  copies this assay can detect is 250 copies / mL. A negative result does not preclude SARS-CoV-2 infection and should not be used as the sole basis for treatment or other patient management decisions.  A negative result may occur with improper specimen collection / handling, submission of specimen other than nasopharyngeal swab, presence of viral mutation(s) within the areas targeted by this assay, and inadequate number of viral copies (<250 copies / mL). A negative result must be combined with clinical observations, patient history, and epidemiological information.  Fact Sheet for Patients:   BoilerBrush.com.cy  Fact Sheet for Healthcare Providers: https://pope.com/  This test is not yet approved or  cleared by the [place] FDA and has been authorized for detection and/or diagnosis of SARS-CoV-2 by FDA under an Emergency Use Authorization (EUA).  This EUA will remain in effect (meaning this test can be used) for the duration of the COVID-19 declaration under Section 564(b)(1) of the Act, 21 U.S.C. section 360bbb-3(b)(1), unless the authorization is terminated or revoked sooner.  Performed at [facility], [address] [place], [place] [address]   Blood culture (routine x 2)     Status: None (Preliminary result)   Collection Time: 06/17/20 10:36 PM   Specimen: BLOOD LEFT FOREARM  Result Value Ref Range Status   Specimen Description BLOOD LEFT FOREARM  Final   Special Requests   Final    BOTTLES DRAWN AEROBIC AND ANAEROBIC Blood Culture adequate volume   Culture   Final    NO GROWTH 4 DAYS Performed at [facility], [address], [place], [place] [address]    Report Status PENDING  Incomplete  Blood culture (routine x 2)     Status: None (Preliminary result)   Collection Time: 06/17/20 10:36 PM   Specimen: Left Antecubital; Blood  Result Value Ref Range Status   Specimen Description LEFT ANTECUBITAL   Final   Special Requests   Final    BOTTLES DRAWN AEROBIC AND ANAEROBIC Blood Culture adequate volume   Culture   Final    NO GROWTH 4 DAYS Performed at [facility], [address], [place], [place] [address]    Report Status PENDING  Incomplete  Urine culture     Status: None   Collection Time: 06/17/20 10:52 PM   Specimen: Urine, Clean Catch  Result Value Ref Range Status   Specimen Description   Final    URINE, CLEAN CATCH Performed at [facility], [address], [place], [place] [address]    Special Requests   Final    NONE Performed at [facility], [address] [place], [place] [address]    Culture   Final    NO GROWTH Performed at [facility], [address], [place], [place] [address]    Report Status 06/19/2020 FINAL  Final     Labs:   CBC: Recent Labs  Lab 06/17/20 1936 06/18/20 0626 06/19/20 0517 06/21/20 0434  WBC 6.2 5.2 3.3* 4.0  NEUTROABS  --   --  2.2 1.4*  HGB 15.2 14.9 12.9* 14.6  HCT 44.1 44.1 37.4* 41.0  MCV 88.4 91.1 88.6 88.7  PLT 286 281 243 211   Basic Metabolic Panel: Recent Labs  Lab 06/17/20 1936 06/18/20 0626 06/19/20 0517 06/21/20 0434  NA 137 136 136 134*  K 4.3 4.0 4.1 4.0  CL 100 101 104 98  CO2 25 26 27 27   GLUCOSE 100* 102* 96 104*  BUN 17 11 9 12   CREATININE 1.21 1.05 0.93 0.79  CALCIUM 8.9 8.3* 7.6* 8.6*  MG  --   --  1.9 2.2   Liver Function Tests: Recent Labs  Lab 06/19/20 1405  AST 139*  ALT 125*  ALKPHOS 33*  BILITOT 0.6  PROT 5.7*  ALBUMIN 3.2*   BNP (last 3 results) No results for input(s): BNP in the last 8760 hours. Cardiac Enzymes: No results for input(s): CKTOTAL, CKMB, CKMBINDEX, TROPONINI in the last 168 hours. CBG: Recent Labs  Lab 06/17/20 1934  GLUCAP 90   Hgb A1c No results for input(s): HGBA1C in the last 72 hours. Lipid Profile No results for input(s): CHOL, HDL, LDLCALC, TRIG, CHOLHDL, LDLDIRECT in the last 72 hours. Thyroid function  studies No results for input(s): TSH, T4TOTAL, T3FREE, THYROIDAB in the last 72 hours.  Invalid input(s): FREET3 Anemia work up No results for input(s): VITAMINB12, FOLATE, FERRITIN, TIBC, IRON, RETICCTPCT in the last 72 hours. Urinalysis    Component Value Date/Time   COLORURINE YELLOW (A) 06/17/2020 1936   APPEARANCEUR HAZY (A) 06/17/2020 1936   LABSPEC 1.031 (H) 06/17/2020 1936   PHURINE 7.0 06/17/2020 1936   GLUCOSEU NEGATIVE 06/17/2020 1936   HGBUR NEGATIVE 06/17/2020 1936   BILIRUBINUR NEGATIVE 06/17/2020 1936   KETONESUR 20 (A) 06/17/2020 1936   PROTEINUR 30 (A) 06/17/2020 1936   NITRITE NEGATIVE 06/17/2020 1936   LEUKOCYTESUR TRACE (A) 06/17/2020 1936         Time coordinating discharge: Over 45 minutes  SIGNED: 08/18/2020, MD, FACP, FHM. [facility],  Please use [email or web address] to Page If 7PM-7AM, please contact night-coverage [email or web address], Password [facility] 06/21/2020, 1:02 PM

## 2020-06-22 ENCOUNTER — Telehealth (HOSPITAL_COMMUNITY): Payer: Self-pay

## 2020-06-22 LAB — QUANTIFERON-TB GOLD PLUS (RQFGPL)
QuantiFERON Mitogen Value: >10 IU/mL
QuantiFERON Nil Value: 0.48 IU/mL
QuantiFERON TB1 Ag Value: 0.48 IU/mL
QuantiFERON TB2 Ag Value: 0.46 IU/mL

## 2020-06-22 LAB — CULTURE, BLOOD (ROUTINE X 2)
Culture: NO GROWTH
Culture: NO GROWTH
Special Requests: ADEQUATE
Special Requests: ADEQUATE

## 2020-06-22 LAB — QUANTIFERON-TB GOLD PLUS: QuantiFERON-TB Gold Plus: NEGATIVE

## 2020-06-23 LAB — HIV-1/2 AB - DIFFERENTIATION
HIV 1 Ab: NEGATIVE
HIV 1 Ab: NEGATIVE
HIV 2 Ab: NEGATIVE
HIV 2 Ab: NEGATIVE
Note: NEGATIVE
Note: NEGATIVE

## 2020-06-23 LAB — RNA QUALITATIVE: HIV 1 RNA Qualitative: 1 — AB

## 2020-06-24 LAB — GC/CHLAMYDIA PROBE AMP
Chlamydia trachomatis, NAA: NEGATIVE
Neisseria Gonorrhoeae by PCR: NEGATIVE

## 2020-06-26 LAB — HSV 1/2 AB IGG/IGM CSF
HSV 1/2 Ab Screen IgG, CSF: <0.34 IV (ref <=0.89)
HSV 1/2 Ab, IgM, CSF: 0.23 IV (ref <=0.89)

## 2020-06-26 LAB — HLA B*5701
HLA B 5701: NEGATIVE
HLA B 5701: NEGATIVE

## 2020-06-27 LAB — GENOSURE PRIME (GSPRIL)

## 2020-07-03 ENCOUNTER — Telehealth: Payer: Self-pay | Admitting: *Deleted

## 2020-07-03 DIAGNOSIS — Z113 Encounter for screening for infections with a predominantly sexual mode of transmission: Secondary | ICD-10-CM

## 2020-07-03 DIAGNOSIS — B20 Human immunodeficiency virus [HIV] disease: Secondary | ICD-10-CM

## 2020-07-03 DIAGNOSIS — Z79899 Other long term (current) drug therapy: Secondary | ICD-10-CM

## 2020-07-03 LAB — HIV GENOSURE(R) MG

## 2020-07-03 LAB — HIV-1 RNA, PCR (GRAPH) RFX/GENO EDI
HIV-1 RNA BY PCR: >10000000 copies/mL
HIV-1 RNA Quant, Log: UNDETERMINED log10copy/mL

## 2020-07-03 LAB — REFLEX TO GENOSURE(R) MG EDI: HIV GenoSure(R): 1

## 2020-07-03 NOTE — Telephone Encounter (Signed)
Patient returned call regarding his appointment 7/22.  He is actually a newly diagnosed B20 who needs labs, financial counseling, and connection to [facility]/counseling.  Additionally, he is leaving for school 07/16/20 at [employer] and would like to be seen before, even if his labs are not all resulted.  RN scheduled labs 7/20, follow up with Dr Drue Second 7/30.  He has medicaid. Will 'cc Don to see if this will cover appropriately.  RN will see patient 7/20, will make connection to supportive care. Andree Coss, RN

## 2020-07-04 ENCOUNTER — Other Ambulatory Visit: Payer: Self-pay

## 2020-07-04 ENCOUNTER — Other Ambulatory Visit: Payer: Medicaid Other

## 2020-07-04 ENCOUNTER — Telehealth: Payer: Self-pay | Admitting: Pharmacy Technician

## 2020-07-04 ENCOUNTER — Ambulatory Visit: Payer: Medicaid Other | Admitting: *Deleted

## 2020-07-04 ENCOUNTER — Other Ambulatory Visit: Payer: Self-pay | Admitting: *Deleted

## 2020-07-04 ENCOUNTER — Other Ambulatory Visit (HOSPITAL_COMMUNITY)
Admission: RE | Admit: 2020-07-04 | Discharge: 2020-07-04 | Disposition: A | Discharge status: Home / Self Care | Payer: Medicaid Other | Source: Ambulatory Visit | Attending: Internal Medicine | Admitting: Internal Medicine

## 2020-07-04 DIAGNOSIS — Z113 Encounter for screening for infections with a predominantly sexual mode of transmission: Secondary | ICD-10-CM

## 2020-07-04 DIAGNOSIS — B20 Human immunodeficiency virus [HIV] disease: Secondary | ICD-10-CM

## 2020-07-04 DIAGNOSIS — Z79899 Other long term (current) drug therapy: Secondary | ICD-10-CM

## 2020-07-04 MED ORDER — BIKTARVY 50-200-25 MG PO TABS: 1.0000 | ORAL_TABLET | Freq: Every day | ORAL | 0 refills | Status: DC

## 2020-07-04 NOTE — Telephone Encounter (Signed)
[facility] Patient Product/process development scientist completed.    The patient is insured through [employer] and has a $0 copay for [employer].   Juan Vaughn. [name] CPhT Specialty Pharmacy Patient [facility] for Infectious Disease Phone: [phone] Fax:  [phone]

## 2020-07-04 NOTE — Telephone Encounter (Signed)
I would see if the consult service can see him if he is unable to be seen before he heads to college

## 2020-07-05 LAB — T-HELPER CELL (CD4) - (RCID CLINIC ONLY)
CD4 % Helper T Cell: 24 % — ABNORMAL LOW (ref 33–65)
CD4 T Cell Abs: 589 /uL (ref 400–1790)

## 2020-07-05 NOTE — Telephone Encounter (Signed)
He met with Dr Delaine Lame and was started on meds while hospitalized earlier this month.  He was given Biktarvy refill this week at lab visit and is seeing you 7/30 before he leaves for school.  He has a school PO Box that can accept medication deliveries from [facility]  He feels comfortable with the support plan offered by Marcie Bal and [facility], is willing to come back as needed from school for follow up. Landis Gandy, RN

## 2020-07-05 NOTE — Progress Notes (Signed)
RN met with patient while he was here for labs.  He was started on Biktarvy ~3 weeks ago while hospitalized, would like to go ahead and pick up a refill this week to avoid any gaps in medication.  We will work with [facility] outpatient to set up deliveries to his school address.  [facility] will connect him to local [facility] for social support while in school. He met with Marcie Bal and has scheduled a visit with her next week.  He has forms from [facility] regarding his health status that he would like to have filled out, is aware that these address chronic HIV rather than acute HIV and he may need to have them filled out at another visit after the medication has had a chance to bring his viral load down.   He is scheduled to meet with Dr Baxter Flattery 07/14/20, is willing to come back from [facility] for follow up appointment.  Landis Gandy, RN

## 2020-07-06 ENCOUNTER — Ambulatory Visit: Payer: Medicaid Other | Admitting: Internal Medicine

## 2020-07-06 LAB — URINE CYTOLOGY ANCILLARY ONLY
Chlamydia: NEGATIVE
Comment: NEGATIVE
Comment: NORMAL
Neisseria Gonorrhea: NEGATIVE

## 2020-07-11 ENCOUNTER — Ambulatory Visit: Payer: Medicaid Other

## 2020-07-11 ENCOUNTER — Other Ambulatory Visit: Payer: Self-pay

## 2020-07-13 ENCOUNTER — Telehealth: Payer: Self-pay | Admitting: Pharmacy Technician

## 2020-07-13 NOTE — Telephone Encounter (Signed)
[facility] Patient Advocate Encounter  Left a HIPPA compliant voicemail with the patient.  When they return call, we want to set up fill of Biktarvy with [facility].  They can call [phone] to set up.    Netty Starring. [name] CPhT Specialty Pharmacy Patient [facility] for Infectious Disease Phone: [phone] Fax:  [phone]

## 2020-07-14 ENCOUNTER — Encounter: Payer: Self-pay | Admitting: Internal Medicine

## 2020-07-14 ENCOUNTER — Ambulatory Visit (INDEPENDENT_AMBULATORY_CARE_PROVIDER_SITE_OTHER): Payer: Medicaid Other | Admitting: Internal Medicine

## 2020-07-14 ENCOUNTER — Other Ambulatory Visit: Payer: Self-pay

## 2020-07-14 VITALS — BP 144/88 | HR 134 | Temp 98.5°F | Wt 117.0 lb

## 2020-07-14 DIAGNOSIS — B20 Human immunodeficiency virus [HIV] disease: Secondary | ICD-10-CM | POA: Diagnosis not present

## 2020-07-14 DIAGNOSIS — Z23 Encounter for immunization: Secondary | ICD-10-CM | POA: Diagnosis present

## 2020-07-14 DIAGNOSIS — R634 Abnormal weight loss: Secondary | ICD-10-CM | POA: Diagnosis not present

## 2020-07-14 DIAGNOSIS — Z79899 Other long term (current) drug therapy: Secondary | ICD-10-CM | POA: Diagnosis not present

## 2020-07-14 LAB — COMPLETE METABOLIC PANEL WITH GFR
AG Ratio: 1.7 (calc) (ref 1.0–2.5)
ALT: 120 U/L — ABNORMAL HIGH (ref 8–46)
AST: 33 U/L — ABNORMAL HIGH (ref 12–32)
Albumin: 4.3 g/dL (ref 3.6–5.1)
Alkaline phosphatase (APISO): 76 U/L (ref 46–169)
BUN: 16 mg/dL (ref 7–20)
CO2: 28 mmol/L (ref 20–32)
Calcium: 9.8 mg/dL (ref 8.9–10.4)
Chloride: 102 mmol/L (ref 98–110)
Creat: 0.91 mg/dL (ref 0.60–1.26)
GFR, Est African American: 141 mL/min/{1.73_m2} (ref >=60)
GFR, Est Non African American: 122 mL/min/{1.73_m2} (ref >=60)
Globulin: 2.6 g/dL (calc) (ref 2.1–3.5)
Glucose, Bld: 78 mg/dL (ref 65–99)
Potassium: 4.2 mmol/L (ref 3.8–5.1)
Sodium: 140 mmol/L (ref 135–146)
Total Bilirubin: 0.5 mg/dL (ref 0.2–1.1)
Total Protein: 6.9 g/dL (ref 6.3–8.2)

## 2020-07-14 LAB — HIV-1/2 AB - DIFFERENTIATION
HIV-1 antibody: POSITIVE — AB
HIV-2 Ab: NEGATIVE

## 2020-07-14 LAB — LIPID PANEL
Cholesterol: 200 mg/dL — ABNORMAL HIGH (ref <170)
HDL: 73 mg/dL (ref >45)
LDL Cholesterol (Calc): 103 mg/dL (calc) (ref <110)
Non-HDL Cholesterol (Calc): 127 mg/dL (calc) — ABNORMAL HIGH (ref <120)
Total CHOL/HDL Ratio: 2.7 (calc) (ref <5.0)
Triglycerides: 137 mg/dL — ABNORMAL HIGH (ref <90)

## 2020-07-14 LAB — HIV-1 RNA QUANT-NO REFLEX-BLD
HIV 1 RNA Quant: 1830 copies/mL — ABNORMAL HIGH
HIV-1 RNA Quant, Log: 3.26 Log copies/mL — ABNORMAL HIGH

## 2020-07-14 LAB — QUANTIFERON-TB GOLD PLUS
Mitogen-NIL: >10 IU/mL
NIL: 0.04 IU/mL
QuantiFERON-TB Gold Plus: NEGATIVE
TB1-NIL: <0 IU/mL
TB2-NIL: 0 IU/mL

## 2020-07-14 LAB — HEPATITIS B CORE ANTIBODY, TOTAL: Hep B Core Total Ab: NONREACTIVE

## 2020-07-14 LAB — HIV ANTIBODY (ROUTINE TESTING W REFLEX): HIV 1&2 Ab, 4th Generation: REACTIVE — AB

## 2020-07-14 MED FILL — BIKTARVY 50-200-25 MG TABS: 50-200-25 | 30 days supply | Qty: 30 | Fill #0

## 2020-07-14 NOTE — Progress Notes (Signed)
Patient ID: Juan Vaughn, adult   DOB: [birth year], 19 y.o.   MRN: [id number]  HPI Juan Vaughn is a 19 yo m with Newly dx with acute hiv diuring hospitalization at end of June presented with GI symptoms in addition to headache, found to have acute hiv with cd 4 count of 200 (estimated )/VL 72M. Had some transaminitis in the 150s. This was the   First time being tested. Has sex with men and women. Had sex with positive HIV male. Not currently in relationship. Present with his aunt who he has disclosed his status to. He is also looking forward to starting flight school. 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  He was started on biktarvy rougly 3 weeks ago, and initial hiv labs showed: cd 4 count of 200(estimated)/VL 72M.  His VL has quickly improved now down to only 1830 and CD 4 count of 589. He states that his GI symptoms and headache have resolved. He is gaining back the weight he loss and getting closer to his baseline weight.  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  Outpatient Encounter Medications as of 07/14/2020  Medication Sig  . bictegravir-emtricitabine-tenofovir AF (BIKTARVY) 50-200-25 MG TABS tablet Take 1 tablet by mouth daily.  Marland Kitchen ibuprofen (ADVIL) 400 MG tablet Take 1 tablet (400 mg total) by mouth every 4 (four) hours as needed for fever.  Marland Kitchen PROCTO-MED HC 2.5 % rectal cream Apply topically.  . sodium fluoride (FLUORISHIELD) 1.1 % GEL dental gel SMARTSIG:Sparingly By Mouth Every Night  . acetaminophen (TYLENOL) 325 MG tablet Take 2 tablets (650 mg total) by mouth every 6 (six) hours as needed for mild pain (or Fever >/= 101).  . promethazine (PHENERGAN) 25 MG tablet Take 25 mg by mouth every 6 (six) hours as needed.   No facility-administered encounter medications on file as of 07/14/2020.     Patient  Active Problem List   Diagnosis Date Noted  . Protein-calorie malnutrition, severe 06/19/2020  . Pneumonia 06/18/2020  . Acute HIV infection (HCC) 06/18/2020  . Aseptic meningitis 06/18/2020     Health Maintenance Due  Topic Date Due  . TETANUS/TDAP  Never done    Social History   Tobacco Use  . Smoking status: Never Smoker  . Smokeless tobacco: Never Used  Vaping Use  . Vaping Use: Never used  Substance Use Topics  . Alcohol use: Never  . Drug use: Never   Family hx: DM, and high blood pressure Review of Systems Review of Systems  Constitutional: Negative for fever, chills, diaphoresis, activity change, appetite change, fatigue and unexpected weight change.  HENT: Negative for congestion, sore throat, rhinorrhea, sneezing, trouble swallowing and sinus pressure.  Eyes: Negative for photophobia and visual disturbance.  Respiratory: Negative for cough, chest tightness, shortness of breath, wheezing and stridor.  Cardiovascular: Negative for chest pain, palpitations and leg swelling.  Gastrointestinal: Negative for nausea, vomiting, abdominal pain, diarrhea, constipation, blood in stool, abdominal distention and anal bleeding.  Genitourinary: Negative for dysuria, hematuria, flank pain and difficulty urinating.  Musculoskeletal: Negative for myalgias, back pain, joint swelling, arthralgias and gait problem.  Skin: Negative for color change, pallor, rash and wound.  Neurological: Negative for dizziness, tremors, weakness and light-headedness.  Hematological: Negative for adenopathy. Does not bruise/bleed easily.  Psychiatric/Behavioral: Negative for behavioral problems, confusion, sleep disturbance, dysphoric mood, decreased concentration  and agitation.    Physical Exam   BP (!) 144/88   Pulse (!) 134   Temp 98.5 F (36.9 C)   Wt 117 lb (53.1 kg)   SpO2 100%   BMI 18.88 kg/m   Physical Exam  Constitutional: He is oriented to person, place, and time. He appears  well-developed and well-nourished. No distress.  HENT:  Mouth/Throat: Oropharynx is clear and moist. No oropharyngeal exudate.  Cardiovascular: Normal rate, regular rhythm and normal heart sounds. Exam reveals no gallop and no friction rub.  No murmur heard.  Pulmonary/Chest: Effort normal and breath sounds normal. No respiratory distress. He has no wheezes.  Abdominal: Soft. Bowel sounds are normal. He exhibits no distension. There is no tenderness.  Lymphadenopathy:  He has no cervical adenopathy.  Neurological: He is alert and oriented to person, place, and time.  Skin: Skin is warm and dry. No rash noted. No erythema.  Psychiatric: He has a normal mood and affect. His behavior is normal.   Neuro-psych = conducted the mini mental status test scored 100%/ 30 of 30 points. Recall included Apple, penny, pencil Lab Results  Component Value Date   CD4TCELL 24 (L) 07/04/2020   Lab Results  Component Value Date   CD4TABS 589 07/04/2020   Lab Results  Component Value Date   HIV1RNAQUANT 1,830 (H) 07/04/2020   No results found for: HEPBSAB Lab Results  Component Value Date   LABRPR NON REACTIVE 06/18/2020    CBC Lab Results  Component Value Date   WBC 4.0 06/21/2020   RBC 4.62 06/21/2020   HGB 14.6 06/21/2020   HCT 41.0 06/21/2020   PLT 211 06/21/2020   MCV 88.7 06/21/2020   MCH 31.6 06/21/2020   MCHC 35.6 06/21/2020   RDW 12.3 06/21/2020   LYMPHSABS 2.4 06/21/2020   MONOABS 0.2 06/21/2020   EOSABS 0.0 06/21/2020    BMET Lab Results  Component Value Date   NA 140 07/04/2020   K 4.2 07/04/2020   CL 102 07/04/2020   CO2 28 07/04/2020   GLUCOSE 78 07/04/2020   BUN 16 07/04/2020   CREATININE 0.91 07/04/2020   CALCIUM 9.8 07/04/2020   GFRNONAA 122 07/04/2020   GFRAA 141 07/04/2020      Assessment and Plan  HIV disease = doing excellent adherence with biktarvy. No side effects. Having brisk response to decrease in VL. Now VL only just under 2,000 and  anticipate to be undetectable within another month of taking his medicaiton  Health maintenance = will give prevnar today, and start hpv series. 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

## 2020-07-18 ENCOUNTER — Encounter: Payer: Self-pay | Admitting: Infectious Disease

## 2020-07-25 ENCOUNTER — Inpatient Hospital Stay: Payer: Medicaid Other | Admitting: Infectious Disease

## 2020-08-07 ENCOUNTER — Other Ambulatory Visit: Payer: Self-pay | Admitting: Family

## 2020-08-07 ENCOUNTER — Other Ambulatory Visit: Payer: Self-pay | Admitting: Internal Medicine

## 2020-08-07 DIAGNOSIS — B20 Human immunodeficiency virus [HIV] disease: Secondary | ICD-10-CM

## 2020-08-08 ENCOUNTER — Other Ambulatory Visit: Payer: Self-pay

## 2020-08-08 ENCOUNTER — Ambulatory Visit: Payer: Medicaid Other

## 2020-08-16 MED FILL — BIKTARVY 50-200-25 MG TABS: 50-200-25 | 30 days supply | Qty: 30 | Fill #0

## 2020-08-17 ENCOUNTER — Ambulatory Visit: Payer: Medicaid Other

## 2020-08-17 ENCOUNTER — Other Ambulatory Visit: Payer: Self-pay

## 2020-08-22 ENCOUNTER — Other Ambulatory Visit: Payer: Self-pay

## 2020-08-22 ENCOUNTER — Ambulatory Visit: Payer: Medicaid Other

## 2020-08-24 ENCOUNTER — Other Ambulatory Visit: Payer: Self-pay

## 2020-08-24 ENCOUNTER — Ambulatory Visit: Payer: Medicaid Other

## 2020-09-12 MED FILL — BIKTARVY 50-200-25 MG TABS: 50-200-25 | 30 days supply | Qty: 30 | Fill #1

## 2020-10-05 ENCOUNTER — Other Ambulatory Visit: Payer: Self-pay

## 2020-10-05 ENCOUNTER — Ambulatory Visit: Payer: Medicaid Other

## 2020-10-09 ENCOUNTER — Encounter: Payer: Self-pay | Admitting: Internal Medicine

## 2020-10-09 ENCOUNTER — Other Ambulatory Visit: Payer: Self-pay

## 2020-10-09 ENCOUNTER — Other Ambulatory Visit (HOSPITAL_COMMUNITY)
Admission: RE | Admit: 2020-10-09 | Discharge: 2020-10-09 | Disposition: A | Discharge status: Home / Self Care | Payer: Medicaid Other | Source: Ambulatory Visit | Attending: Internal Medicine | Admitting: Internal Medicine

## 2020-10-09 ENCOUNTER — Ambulatory Visit (INDEPENDENT_AMBULATORY_CARE_PROVIDER_SITE_OTHER): Payer: Medicaid Other | Admitting: Internal Medicine

## 2020-10-09 VITALS — BP 142/87 | HR 103 | Temp 98.0°F | Ht 66.0 in | Wt 125.0 lb

## 2020-10-09 DIAGNOSIS — B20 Human immunodeficiency virus [HIV] disease: Secondary | ICD-10-CM | POA: Diagnosis present

## 2020-10-09 DIAGNOSIS — Z23 Encounter for immunization: Secondary | ICD-10-CM | POA: Diagnosis not present

## 2020-10-09 DIAGNOSIS — Z113 Encounter for screening for infections with a predominantly sexual mode of transmission: Secondary | ICD-10-CM

## 2020-10-09 DIAGNOSIS — Z79899 Other long term (current) drug therapy: Secondary | ICD-10-CM

## 2020-10-09 NOTE — Progress Notes (Signed)
RFV: follow up for hiv disease  Patient ID: Juan Vaughn, adult   DOB: 02-21-2001, 19 y.o.   MRN: [id number]  HPI  18yo M with hiv disease, started on bitkarvy in early July had quick viral decrease from 1 month of medications. continues On biktarvy  Has not had recent lab tests since end of July. He did get his covid vaccine  moderna #1- Lot [id number] on 02/22/20 and #2- lot#[id number] on 03/21/20  Overall doing well. Not had covid exposure.   Has had some weight gain with getting back on ART. Also not necessarily having any memory issues.   Outpatient Encounter Medications as of 10/09/2020  Medication Sig  . BIKTARVY 50-200-25 MG TABS tablet TAKE 1 TABLET BY MOUTH DAILY.  Marland Kitchen ibuprofen (ADVIL) 400 MG tablet Take 1 tablet (400 mg total) by mouth every 4 (four) hours as needed for fever.  Marland Kitchen PROCTO-MED HC 2.5 % rectal cream Apply topically.  . sodium fluoride (FLUORISHIELD) 1.1 % GEL dental gel SMARTSIG:Sparingly By Mouth Every Night  . promethazine (PHENERGAN) 25 MG tablet Take 25 mg by mouth every 6 (six) hours as needed.   No facility-administered encounter medications on file as of 10/09/2020.     Patient Active Problem List   Diagnosis Date Noted  . Protein-calorie malnutrition, severe 06/19/2020  . Pneumonia 06/18/2020  . Acute HIV infection (HCC) 06/18/2020  . Aseptic meningitis 06/18/2020     Health Maintenance Due  Topic Date Due  . INFLUENZA VACCINE  07/16/2020    Social History   Tobacco Use  . Smoking status: Never Smoker  . Smokeless tobacco: Never Used  Vaping Use  . Vaping Use: Never used  Substance Use Topics  . Alcohol use: Never  . Drug use: Never   Review of Systems Constitutional: Negative for fever, chills, diaphoresis, activity change, appetite change, fatigue and unexpected weight change.  HENT: Negative for congestion, sore throat, rhinorrhea, sneezing, trouble swallowing and sinus pressure.  Eyes: Negative for photophobia and visual disturbance.   Respiratory: Negative for cough, chest tightness, shortness of breath, wheezing and stridor.  Cardiovascular: Negative for chest pain, palpitations and leg swelling.  Gastrointestinal: Negative for nausea, vomiting, abdominal pain, diarrhea, constipation, blood in stool, abdominal distention and anal bleeding.  Genitourinary: Negative for dysuria, hematuria, flank pain and difficulty urinating.  Musculoskeletal: Negative for myalgias, back pain, joint swelling, arthralgias and gait problem.  Skin: Negative for color change, pallor, rash and wound.  Neurological: Negative for dizziness, tremors, weakness and light-headedness.  Hematological: Negative for adenopathy. Does not bruise/bleed easily.  Psychiatric/Behavioral: Negative for behavioral problems, confusion, sleep disturbance, dysphoric mood, decreased concentration and agitation.    Physical Exam   BP (!) 142/87   Pulse (!) 103   Temp 98 F (36.7 C)   Ht 5\' 6"  (1.676 m)   Wt 125 lb (56.7 kg)   BMI 20.18 kg/m   Physical Exam  Constitutional: He is oriented to person, place, and time. He appears well-developed and well-nourished. No distress.  HENT:  Mouth/Throat: Oropharynx is clear and moist. No oropharyngeal exudate.  Cardiovascular: Normal rate, regular rhythm and normal heart sounds. Exam reveals no gallop and no friction rub.  No murmur heard.  Pulmonary/Chest: Effort normal and breath sounds normal. No respiratory distress. He has no wheezes.  Abdominal: Soft. Bowel sounds are normal. He exhibits no distension. There is no tenderness.  Lymphadenopathy:  He has no cervical adenopathy.  Neurological: He is alert and oriented to person, place, and time.  Skin:  Skin is warm and dry. No rash noted. No erythema.  Psychiatric: He has a normal mood and affect. His behavior is normal.    Lab Results  Component Value Date   CD4TCELL 24 (L) 07/04/2020   Lab Results  Component Value Date   CD4TABS 589 07/04/2020   Lab  Results  Component Value Date   HIV1RNAQUANT 1,830 (H) 07/04/2020   No results found for: HEPBSAB Lab Results  Component Value Date   LABRPR NON REACTIVE 06/18/2020    CBC Lab Results  Component Value Date   WBC 4.0 06/21/2020   RBC 4.62 06/21/2020   HGB 14.6 06/21/2020   HCT 41.0 06/21/2020   PLT 211 06/21/2020   MCV 88.7 06/21/2020   MCH 31.6 06/21/2020   MCHC 35.6 06/21/2020   RDW 12.3 06/21/2020   LYMPHSABS 2.4 06/21/2020   MONOABS 0.2 06/21/2020   EOSABS 0.0 06/21/2020    BMET Lab Results  Component Value Date   NA 140 07/04/2020   K 4.2 07/04/2020   CL 102 07/04/2020   CO2 28 07/04/2020   GLUCOSE 78 07/04/2020   BUN 16 07/04/2020   CREATININE 0.91 07/04/2020   CALCIUM 9.8 07/04/2020   GFRNONAA 122 07/04/2020   GFRAA 141 07/04/2020      Assessment and Plan HIV disease= will check labs  Sexual active = will screen for sti  Long erm meidcation management = will need to check cr  Health maintenance/vaccinations that are due:  Needs flu, hpv #2  Has moderna vaccine - had 2 doses in April.   Booster dose due of moderna 1/2 -- in next couple weeks

## 2020-10-10 ENCOUNTER — Ambulatory Visit: Payer: Medicaid Other

## 2020-10-10 LAB — URINE CYTOLOGY ANCILLARY ONLY
Chlamydia: NEGATIVE
Comment: NEGATIVE
Comment: NORMAL
Neisseria Gonorrhea: NEGATIVE

## 2020-10-10 LAB — T-HELPER CELL (CD4) - (RCID CLINIC ONLY)
CD4 % Helper T Cell: 29 % — ABNORMAL LOW (ref 33–65)
CD4 T Cell Abs: 620 /uL (ref 400–1790)

## 2020-10-10 LAB — CYTOLOGY, (ORAL, ANAL, URETHRAL) ANCILLARY ONLY
Chlamydia: NEGATIVE
Chlamydia: NEGATIVE
Comment: NEGATIVE
Comment: NEGATIVE
Comment: NORMAL
Comment: NORMAL
Neisseria Gonorrhea: NEGATIVE
Neisseria Gonorrhea: NEGATIVE

## 2020-10-10 MED FILL — BIKTARVY 50-200-25 MG TABS: 50-200-25 | 30 days supply | Qty: 30 | Fill #2

## 2020-10-11 LAB — CBC WITH DIFFERENTIAL/PLATELET
Absolute Monocytes: 770 cells/uL (ref 200–950)
Basophils Absolute: 30 cells/uL (ref 0–200)
Basophils Relative: 0.3 %
Eosinophils Absolute: 60 cells/uL (ref 15–500)
Eosinophils Relative: 0.6 %
HCT: 48.2 % (ref 38.5–50.0)
Hemoglobin: 16.6 g/dL (ref 13.2–17.1)
Lymphs Abs: 2500 cells/uL (ref 850–3900)
MCH: 32.7 pg (ref 27.0–33.0)
MCHC: 34.4 g/dL (ref 32.0–36.0)
MCV: 95.1 fL (ref 80.0–100.0)
MPV: 9.4 fL (ref 7.5–12.5)
Monocytes Relative: 7.7 %
Neutro Abs: 6640 cells/uL (ref 1500–7800)
Neutrophils Relative %: 66.4 %
Platelets: 188 10*3/uL (ref 140–400)
RBC: 5.07 10*6/uL (ref 4.20–5.80)
RDW: 11.5 % (ref 11.0–15.0)
Total Lymphocyte: 25 %
WBC: 10 10*3/uL (ref 3.8–10.8)

## 2020-10-11 LAB — COMPLETE METABOLIC PANEL WITH GFR
AG Ratio: 1.7 (calc) (ref 1.0–2.5)
ALT: 14 U/L (ref 8–46)
AST: 18 U/L (ref 12–32)
Albumin: 4.7 g/dL (ref 3.6–5.1)
Alkaline phosphatase (APISO): 64 U/L (ref 46–169)
BUN: 13 mg/dL (ref 7–20)
CO2: 29 mmol/L (ref 20–32)
Calcium: 9.9 mg/dL (ref 8.9–10.4)
Chloride: 101 mmol/L (ref 98–110)
Creat: 0.96 mg/dL (ref 0.60–1.26)
GFR, Est African American: 132 mL/min/{1.73_m2} (ref >=60)
GFR, Est Non African American: 114 mL/min/{1.73_m2} (ref >=60)
Globulin: 2.7 g/dL (calc) (ref 2.1–3.5)
Glucose, Bld: 90 mg/dL (ref 65–99)
Potassium: 3.8 mmol/L (ref 3.8–5.1)
Sodium: 138 mmol/L (ref 135–146)
Total Bilirubin: 0.6 mg/dL (ref 0.2–1.1)
Total Protein: 7.4 g/dL (ref 6.3–8.2)

## 2020-10-11 LAB — HIV-1 RNA QUANT-NO REFLEX-BLD
HIV 1 RNA Quant: 91 Copies/mL — ABNORMAL HIGH
HIV-1 RNA Quant, Log: 1.96 Log cps/mL — ABNORMAL HIGH

## 2020-10-11 LAB — RPR: RPR Ser Ql: NONREACTIVE

## 2020-10-12 ENCOUNTER — Ambulatory Visit: Payer: Medicaid Other

## 2020-10-12 ENCOUNTER — Other Ambulatory Visit: Payer: Self-pay

## 2020-10-26 ENCOUNTER — Other Ambulatory Visit: Payer: Self-pay

## 2020-10-26 ENCOUNTER — Ambulatory Visit: Payer: Medicaid Other

## 2020-10-26 NOTE — Progress Notes (Unsigned)
Mental Health Therapist Progress Note   Name: Renaud Celli  Total time: 30  Type of Service: Individual Outpatient Mental Health Therapy  OBJECTIVE:  Mood: Euthymic and Affect: Appropriate Risk of harm to self or others: No plan to harm self or others  DIAGNOSIS:   GOALS ADDRESSED:  Patient will: 1.  Reduce symptoms of: stress  2.  Increase knowledge and/or ability of: coping skills  3.  Demonstrate ability to: Increase healthy adjustment to current life circumstances  INTERVENTIONS: Interventions utilized:  Supportive Counseling Therapist met with patient for outpatient mental health individual therapy to include ongoing assessment, support, and reinforcement.  Therapist allowed patient to "check in" since previous session; asking patient to share any positive coping skills they may have used over the previous week, along with any challenges faced.  Therapist provided supportive listening as patient processed their thoughts, emotional responses, and behaviors surrounding several stressors.  EFFECTIVENESS/PLAN: Client was alert, oriented x3, with no SI, HI, or symptoms of psychosis (risk low).  Client was pleasant and friendly, engaging openly and appropriately with therapist, benefiting from supportive listening and exploration of feelings.  Next session recommended in one to two weeks.   Hughes Better, LCSW

## 2020-11-02 ENCOUNTER — Other Ambulatory Visit: Payer: Self-pay

## 2020-11-02 ENCOUNTER — Ambulatory Visit: Payer: Medicaid Other

## 2020-11-02 DIAGNOSIS — F4322 Adjustment disorder with anxiety: Secondary | ICD-10-CM

## 2020-11-02 NOTE — Progress Notes (Signed)
Mental Health Therapist Progress Note   Name: Liron Goodlin  Total time: 30  Type of Service: Individual Outpatient Mental Health Therapy  OBJECTIVE:  Mood: Euthymic and Affect: Appropriate Risk of harm to self or others: No plan to harm self or others  DIAGNOSIS:   GOALS ADDRESSED:  Patient will: 1.  Reduce symptoms of: stress  2.  Increase knowledge and/or ability of: coping skills  3.  Demonstrate ability to: Increase healthy adjustment to current life circumstances  INTERVENTIONS: Interventions utilized:  Supportive Counseling Therapist met with patient for outpatient mental health individual therapy to include ongoing assessment, support, and reinforcement.  Therapist allowed patient to "check in" since previous session; asking patient to share any positive coping skills they may have used over the previous week, along with any challenges faced.  Therapist provided supportive listening as patient processed their thoughts, emotional responses, and behaviors surrounding several stressors.  EFFECTIVENESS/PLAN: Client was alert, oriented x3, with no SI, HI, or symptoms of psychosis (risk low).  Client was pleasant and friendly, engaging openly and appropriately with therapist, benefiting from supportive listening and exploration of feelings.  Next session recommended in one to two weeks.   Ehtan Delfavero L Marcellina Jonsson, LCSW  

## 2020-11-03 ENCOUNTER — Other Ambulatory Visit: Payer: Self-pay | Admitting: Internal Medicine

## 2020-11-03 DIAGNOSIS — B20 Human immunodeficiency virus [HIV] disease: Secondary | ICD-10-CM

## 2020-11-07 MED FILL — BIKTARVY 50-200-25 MG TABS: 50-200-25 | 30 days supply | Qty: 30 | Fill #0

## 2020-11-14 ENCOUNTER — Other Ambulatory Visit: Payer: Self-pay

## 2020-11-14 ENCOUNTER — Ambulatory Visit: Payer: Medicaid Other

## 2020-12-04 MED FILL — BIKTARVY 50-200-25 MG TABS: 50-200-25 | 30 days supply | Qty: 30 | Fill #1

## 2020-12-12 ENCOUNTER — Ambulatory Visit: Payer: Medicaid Other

## 2020-12-12 ENCOUNTER — Other Ambulatory Visit: Payer: Self-pay

## 2020-12-21 ENCOUNTER — Ambulatory Visit: Payer: Medicaid Other

## 2020-12-21 ENCOUNTER — Other Ambulatory Visit: Payer: Self-pay

## 2020-12-28 MED FILL — BIKTARVY 50-200-25 MG TABS: 50-200-25 | 30 days supply | Qty: 30 | Fill #2

## 2021-01-15 ENCOUNTER — Ambulatory Visit (INDEPENDENT_AMBULATORY_CARE_PROVIDER_SITE_OTHER): Payer: Medicaid Other | Admitting: Internal Medicine

## 2021-01-15 ENCOUNTER — Encounter: Payer: Self-pay | Admitting: Internal Medicine

## 2021-01-15 ENCOUNTER — Other Ambulatory Visit: Payer: Self-pay

## 2021-01-15 VITALS — BP 135/82 | HR 130 | Temp 98.3°F | Ht 66.0 in | Wt 128.0 lb

## 2021-01-15 DIAGNOSIS — Z23 Encounter for immunization: Secondary | ICD-10-CM | POA: Diagnosis not present

## 2021-01-15 DIAGNOSIS — R000 Tachycardia, unspecified: Secondary | ICD-10-CM

## 2021-01-15 DIAGNOSIS — B20 Human immunodeficiency virus [HIV] disease: Secondary | ICD-10-CM | POA: Diagnosis not present

## 2021-01-15 DIAGNOSIS — Z79899 Other long term (current) drug therapy: Secondary | ICD-10-CM

## 2021-01-15 NOTE — Progress Notes (Signed)
RFV: follow up for hiv disease  Patient ID: Juan Vaughn, adult   DOB: 11/21/2001, 19 y.o.   MRN: [id number]  HPI 19yo nonbinary individual with well controlled hiv disease, CD 4 count of 620/VL 91. On biktarvy uptodate on flu, and boosted covid vaccine.(had booster in Dec 04, 2020)  Now out of relationship = it didn't think it was going to work, roughly a month ago.  3 shots of espresso twice a week.   In a 10 yr- hiv study ending in march through [employer].   Outpatient Encounter Medications as of 01/15/2021  Medication Sig  . BIKTARVY 50-200-25 MG TABS tablet TAKE 1 TABLET BY MOUTH DAILY.  Marland Kitchen ibuprofen (ADVIL) 400 MG tablet Take 1 tablet (400 mg total) by mouth every 4 (four) hours as needed for fever.  Marland Kitchen PROCTO-MED HC 2.5 % rectal cream Apply topically.  . promethazine (PHENERGAN) 25 MG tablet Take 25 mg by mouth every 6 (six) hours as needed.  . sodium fluoride (FLUORISHIELD) 1.1 % GEL dental gel SMARTSIG:Sparingly By Mouth Every Night   No facility-administered encounter medications on file as of 01/15/2021.     Patient Active Problem List   Diagnosis Date Noted  . Protein-calorie malnutrition, severe 06/19/2020  . Pneumonia 06/18/2020  . Acute HIV infection (HCC) 06/18/2020  . Aseptic meningitis 06/18/2020    There are no preventive care reminders to display for this patient.   Review of Systems  Constitutional: Negative for fever, chills, diaphoresis, activity change, appetite change, fatigue and unexpected weight change.  HENT: Negative for congestion, sore throat, rhinorrhea, sneezing, trouble swallowing and sinus pressure.  Eyes: Negative for photophobia and visual disturbance.  Respiratory: Negative for cough, chest tightness, shortness of breath, wheezing and stridor.  Cardiovascular: Negative for chest pain, palpitations and leg swelling.  Gastrointestinal: Negative for nausea, vomiting, abdominal pain, diarrhea, constipation, blood in stool, abdominal distention  and anal bleeding.  Genitourinary: Negative for dysuria, hematuria, flank pain and difficulty urinating.  Musculoskeletal: Negative for myalgias, back pain, joint swelling, arthralgias and gait problem.  Skin: Negative for color change, pallor, rash and wound.  Neurological: Negative for dizziness, tremors, weakness and light-headedness.  Hematological: Negative for adenopathy. Does not bruise/bleed easily.  Psychiatric/Behavioral: Negative for behavioral problems, confusion, sleep disturbance, dysphoric mood, decreased concentration and agitation.    Physical Exam   BP 135/82   Pulse (!) 130   Temp 98.3 F (36.8 C) (Oral)   Ht 5\' 6"  (1.676 m)   Wt 128 lb (58.1 kg)   SpO2 100%   BMI 20.66 kg/m   Physical Exam  Constitutional: He is oriented to person, place, and time. He appears well-developed and well-nourished. No distress.  HENT:  Mouth/Throat: Oropharynx is clear and moist. No oropharyngeal exudate.  Cardiovascular:tachycardia, regular rhythm and normal heart sounds. Exam reveals no gallop and no friction rub.  Pulses= +2 Lymphadenopathy:  He has no cervical adenopathy.  Neurological: He is alert and oriented to person, place, and time.  Skin: Skin is warm and dry. No rash noted. No erythema.  Psychiatric: He has a normal mood and affect. His behavior is normal.    Lab Results  Component Value Date   CD4TCELL 29 (L) 10/09/2020   Lab Results  Component Value Date   CD4TABS 620 10/09/2020   CD4TABS 589 07/04/2020   Lab Results  Component Value Date   HIV1RNAQUANT 91 (H) 10/09/2020   No results found for: HEPBSAB Lab Results  Component Value Date   LABRPR NON-REACTIVE 10/09/2020  CBC Lab Results  Component Value Date   WBC 10.0 10/09/2020   RBC 5.07 10/09/2020   HGB 16.6 10/09/2020   HCT 48.2 10/09/2020   PLT 188 10/09/2020   MCV 95.1 10/09/2020   MCH 32.7 10/09/2020   MCHC 34.4 10/09/2020   RDW 11.5 10/09/2020   LYMPHSABS 2,500 10/09/2020   MONOABS  0.2 06/21/2020   EOSABS 60 10/09/2020    BMET Lab Results  Component Value Date   NA 138 10/09/2020   K 3.8 10/09/2020   CL 101 10/09/2020   CO2 29 10/09/2020   GLUCOSE 90 10/09/2020   BUN 13 10/09/2020   CREATININE 0.96 10/09/2020   CALCIUM 9.9 10/09/2020   GFRNONAA 114 10/09/2020   GFRAA 132 10/09/2020    Assessment and Plan  HIV disease = will check labs today. See if undetectable. Anticipate to continue on biktarvy  Sinus Tachycardia = asymptomatic, likely caffeine induced. Mentioned that he needs to consider decreasing caffeine intake and see if HR closer to 80 at resting.  Health maintenance uptodate except hpv vaccine, give 3rd dose today.(2nd dose he thinks was 10/25 - not recorded)  Long term medication management = will check cr to see still at baseline

## 2021-01-16 LAB — T-HELPER CELL (CD4) - (RCID CLINIC ONLY)
CD4 % Helper T Cell: 34 % (ref 33–65)
CD4 T Cell Abs: 650 /uL (ref 400–1790)

## 2021-01-18 LAB — CBC WITH DIFFERENTIAL/PLATELET
Absolute Monocytes: 502 cells/uL (ref 200–950)
Basophils Absolute: 18 cells/uL (ref 0–200)
Basophils Relative: 0.3 %
Eosinophils Absolute: 18 cells/uL (ref 15–500)
Eosinophils Relative: 0.3 %
HCT: 47.9 % (ref 38.5–50.0)
Hemoglobin: 16.5 g/dL (ref 13.2–17.1)
Lymphs Abs: 2106 cells/uL (ref 850–3900)
MCH: 32.7 pg (ref 27.0–33.0)
MCHC: 34.4 g/dL (ref 32.0–36.0)
MCV: 95 fL (ref 80.0–100.0)
MPV: 9.6 fL (ref 7.5–12.5)
Monocytes Relative: 8.5 %
Neutro Abs: 3257 cells/uL (ref 1500–7800)
Neutrophils Relative %: 55.2 %
Platelets: 197 10*3/uL (ref 140–400)
RBC: 5.04 10*6/uL (ref 4.20–5.80)
RDW: 12.6 % (ref 11.0–15.0)
Total Lymphocyte: 35.7 %
WBC: 5.9 10*3/uL (ref 3.8–10.8)

## 2021-01-18 LAB — COMPLETE METABOLIC PANEL WITH GFR
AG Ratio: 1.8 (calc) (ref 1.0–2.5)
ALT: 14 U/L (ref 9–46)
AST: 15 U/L (ref 10–40)
Albumin: 4.4 g/dL (ref 3.6–5.1)
Alkaline phosphatase (APISO): 41 U/L (ref 36–130)
BUN: 13 mg/dL (ref 7–25)
CO2: 30 mmol/L (ref 20–32)
Calcium: 9.5 mg/dL (ref 8.6–10.3)
Chloride: 105 mmol/L (ref 98–110)
Creat: 0.88 mg/dL (ref 0.60–1.35)
GFR, Est African American: 143 mL/min/{1.73_m2} (ref >=60)
GFR, Est Non African American: 124 mL/min/{1.73_m2} (ref >=60)
Globulin: 2.4 g/dL (calc) (ref 1.9–3.7)
Glucose, Bld: 77 mg/dL (ref 65–99)
Potassium: 3.9 mmol/L (ref 3.5–5.3)
Sodium: 141 mmol/L (ref 135–146)
Total Bilirubin: 0.7 mg/dL (ref 0.2–1.2)
Total Protein: 6.8 g/dL (ref 6.1–8.1)

## 2021-01-18 LAB — HIV-1 RNA QUANT-NO REFLEX-BLD
HIV 1 RNA Quant: <20 Copies/mL — ABNORMAL HIGH
HIV-1 RNA Quant, Log: <1.3 Log cps/mL — ABNORMAL HIGH

## 2021-01-18 LAB — RPR: RPR Ser Ql: NONREACTIVE

## 2021-01-29 ENCOUNTER — Other Ambulatory Visit: Payer: Self-pay | Admitting: Internal Medicine

## 2021-01-29 ENCOUNTER — Telehealth: Payer: Self-pay

## 2021-01-29 DIAGNOSIS — B20 Human immunodeficiency virus [HIV] disease: Secondary | ICD-10-CM

## 2021-01-29 NOTE — Telephone Encounter (Signed)
Left patient a voice mail to call back to schedule a 3 month f/u with Dr. Drue Second.

## 2021-01-31 MED FILL — BIKTARVY 50-200-25 MG TABS: 50-200-25 | 30 days supply | Qty: 30 | Fill #0

## 2021-02-23 ENCOUNTER — Other Ambulatory Visit: Payer: Self-pay | Admitting: Internal Medicine

## 2021-02-23 DIAGNOSIS — B20 Human immunodeficiency virus [HIV] disease: Secondary | ICD-10-CM

## 2021-03-15 ENCOUNTER — Other Ambulatory Visit (HOSPITAL_COMMUNITY): Payer: Self-pay

## 2021-04-03 ENCOUNTER — Other Ambulatory Visit (HOSPITAL_COMMUNITY): Payer: Self-pay

## 2021-04-03 MED FILL — Bictegravir-Emtricitabine-Tenofovir AF Tab 50-200-25 MG: ORAL | 30 days supply | Qty: 30 | Fill #0 | Status: AC

## 2021-04-04 ENCOUNTER — Other Ambulatory Visit (HOSPITAL_COMMUNITY): Payer: Self-pay

## 2021-04-16 ENCOUNTER — Ambulatory Visit: Payer: Medicaid Other | Admitting: Internal Medicine

## 2021-04-27 ENCOUNTER — Ambulatory Visit (INDEPENDENT_AMBULATORY_CARE_PROVIDER_SITE_OTHER): Payer: Medicaid Other | Admitting: Internal Medicine

## 2021-04-27 ENCOUNTER — Other Ambulatory Visit (HOSPITAL_COMMUNITY): Payer: Self-pay

## 2021-04-27 ENCOUNTER — Other Ambulatory Visit: Payer: Self-pay

## 2021-04-27 ENCOUNTER — Other Ambulatory Visit (HOSPITAL_COMMUNITY)
Admission: RE | Admit: 2021-04-27 | Discharge: 2021-04-27 | Disposition: A | Discharge status: Home / Self Care | Payer: Medicaid Other | Source: Ambulatory Visit | Attending: Internal Medicine | Admitting: Internal Medicine

## 2021-04-27 ENCOUNTER — Encounter: Payer: Self-pay | Admitting: Internal Medicine

## 2021-04-27 VITALS — BP 151/78 | HR 117 | Resp 16 | Ht 66.0 in | Wt 123.0 lb

## 2021-04-27 DIAGNOSIS — N5082 Scrotal pain: Secondary | ICD-10-CM | POA: Diagnosis not present

## 2021-04-27 DIAGNOSIS — B20 Human immunodeficiency virus [HIV] disease: Secondary | ICD-10-CM | POA: Diagnosis present

## 2021-04-27 DIAGNOSIS — A64 Unspecified sexually transmitted disease: Secondary | ICD-10-CM | POA: Diagnosis not present

## 2021-04-27 MED ORDER — DOXYCYCLINE HYCLATE 100 MG PO TABS
100.0000 mg | ORAL_TABLET | Freq: Two times a day (BID) | ORAL | 0 refills | Status: DC
  Filled 2021-04-27: qty 20, 10d supply, fill #0

## 2021-04-27 MED ORDER — CEFTRIAXONE SODIUM 500 MG IJ SOLR
500.0000 mg | Freq: Once | INTRAMUSCULAR | Status: AC
  Administered 2021-04-27: 500 mg via INTRAMUSCULAR

## 2021-04-27 NOTE — Progress Notes (Signed)
RFV: follow up for hiv disease  Patient ID: Juan Vaughn, adult   DOB: 13-Oct-2001, 19 y.o.   MRN: [id number]  HPI Juan Vaughn with HIV disease, CD 4 count of 650/VL<20 in Jan 2022 Having 2 day history lf left testicular pain, also noticed semen change in color (more yellow than whitish/clear). No penile discharge  Had unprotected oral sex about a week ago. Has had the same partner for the past 6 months  Soc hx: Finished this past year of school  Working at [employer] at [employer]  Outpatient Encounter Medications as of 04/27/2021  Medication Sig  . bictegravir-emtricitabine-tenofovir AF (BIKTARVY) 50-200-25 MG TABS tablet TAKE 1 TABLET BY MOUTH DAILY.  Marland Kitchen ibuprofen (ADVIL) 400 MG tablet Take 1 tablet (400 mg total) by mouth every 4 (four) hours as needed for fever.  Marland Kitchen PROCTO-MED HC 2.5 % rectal cream Apply topically.  . promethazine (PHENERGAN) 25 MG tablet Take 25 mg by mouth every 6 (six) hours as needed.  . sodium fluoride (FLUORISHIELD) 1.1 % GEL dental gel SMARTSIG:Sparingly By Mouth Every Night   No facility-administered encounter medications on file as of 04/27/2021.     Patient Active Problem List   Diagnosis Date Noted  . Protein-calorie malnutrition, severe 06/19/2020  . Pneumonia 06/18/2020  . Acute HIV infection (HCC) 06/18/2020  . Aseptic meningitis 06/18/2020     Health Maintenance Due  Topic Date Due  . HPV VACCINES (3 - Risk 3-dose series) 05/15/2021    Social History   Tobacco Use  . Smoking status: Never Smoker  . Smokeless tobacco: Never Used  Vaping Use  . Vaping Use: Never used  Substance Use Topics  . Alcohol use: Never  . Drug use: Never   Review of Systems 12 point ros is negative except for testicular discomfort, no penile leakage Physical Exam   BP (!) 151/78   Pulse (!) 117   Resp 16   Ht 5\' 6"  (1.676 m)   Wt 123 lb (55.8 kg)   SpO2 100%   BMI 19.85 kg/m   Physical Exam  Constitutional: He is oriented to person, place, and time. He  appears well-developed and well-nourished. No distress.  HENT:  Mouth/Throat: Oropharynx is clear and moist. No oropharyngeal exudate.  Cardiovascular: Normal rate, regular rhythm and normal heart sounds. Exam reveals no gallop and no friction rub.  No murmur heard.  Pulmonary/Chest: Effort normal and breath sounds normal. No respiratory distress. He has no wheezes.  Abdominal: Soft. Bowel sounds are normal. He exhibits no distension. There is no tenderness.  Gu= no skin lesions to shaft of penis nor lesions to scrotum Neurological: He is alert and oriented to person, place, and time.  Skin: Skin is warm and dry. No rash noted. No erythema.  Psychiatric: He has a normal mood and affect. His behavior is normal.    Lab Results  Component Value Date   CD4TCELL 34 01/15/2021   Lab Results  Component Value Date   CD4TABS 650 01/15/2021   CD4TABS 620 10/09/2020   CD4TABS 589 07/04/2020   Lab Results  Component Value Date   HIV1RNAQUANT <20 (H) 01/15/2021   No results found for: HEPBSAB Lab Results  Component Value Date   LABRPR NON-REACTIVE 01/15/2021    CBC Lab Results  Component Value Date   WBC 5.9 01/15/2021   RBC 5.04 01/15/2021   HGB 16.5 01/15/2021   HCT 47.9 01/15/2021   PLT 197 01/15/2021   MCV 95.0 01/15/2021   MCH 32.7  01/15/2021   MCHC 34.4 01/15/2021   RDW 12.6 01/15/2021   LYMPHSABS 2,106 01/15/2021   MONOABS 0.2 06/21/2020   EOSABS 18 01/15/2021    BMET Lab Results  Component Value Date   NA 141 01/15/2021   K 3.9 01/15/2021   CL 105 01/15/2021   CO2 30 01/15/2021   GLUCOSE 77 01/15/2021   BUN 13 01/15/2021   CREATININE 0.88 01/15/2021   CALCIUM 9.5 01/15/2021   GFRNONAA 124 01/15/2021   GFRAA 143 01/15/2021      Assessment and Plan Testicular pain/epidimytis = concern for STI. Will do 10 days of doxy, plus IM ceftiraxone, and do STI testing  HIV disease =Continue on biktarvy. Will give refills. Will check labs to see he is  undetectable  Long term medication management = cr is stable

## 2021-04-30 LAB — URINE CYTOLOGY ANCILLARY ONLY
Chlamydia: NEGATIVE
Comment: NEGATIVE
Comment: NORMAL
Neisseria Gonorrhea: NEGATIVE

## 2021-04-30 LAB — CYTOLOGY, (ORAL, ANAL, URETHRAL) ANCILLARY ONLY
Chlamydia: NEGATIVE
Comment: NEGATIVE
Comment: NORMAL
Neisseria Gonorrhea: NEGATIVE

## 2021-04-30 LAB — T-HELPER CELLS (CD4) COUNT (NOT AT ARMC)
Absolute CD4: 1021 cells/uL (ref 490–1740)
CD4 T Helper %: 35 % (ref 30–61)
Total lymphocyte count: 2949 cells/uL (ref 850–3900)

## 2021-04-30 LAB — RPR: RPR Ser Ql: NONREACTIVE

## 2021-04-30 LAB — HIV-1 RNA QUANT-NO REFLEX-BLD
HIV 1 RNA Quant: <20 Copies/mL — ABNORMAL HIGH
HIV-1 RNA Quant, Log: <1.3 Log cps/mL — ABNORMAL HIGH

## 2021-05-01 ENCOUNTER — Other Ambulatory Visit (HOSPITAL_COMMUNITY): Payer: Self-pay

## 2021-05-01 MED FILL — Bictegravir-Emtricitabine-Tenofovir AF Tab 50-200-25 MG: ORAL | 30 days supply | Qty: 30 | Fill #1 | Status: AC

## 2021-05-04 ENCOUNTER — Other Ambulatory Visit: Payer: Self-pay

## 2021-05-04 ENCOUNTER — Other Ambulatory Visit (HOSPITAL_COMMUNITY): Payer: Self-pay

## 2021-05-04 ENCOUNTER — Other Ambulatory Visit: Payer: Self-pay | Admitting: Internal Medicine

## 2021-05-04 MED ORDER — DOXYCYCLINE HYCLATE 100 MG PO TABS
100.0000 mg | ORAL_TABLET | Freq: Two times a day (BID) | ORAL | 0 refills | Status: DC
  Filled 2021-05-04: qty 6, 3d supply, fill #0

## 2021-05-05 ENCOUNTER — Other Ambulatory Visit (HOSPITAL_COMMUNITY): Payer: Self-pay

## 2021-05-06 ENCOUNTER — Other Ambulatory Visit (HOSPITAL_COMMUNITY): Payer: Self-pay

## 2021-05-07 ENCOUNTER — Other Ambulatory Visit (HOSPITAL_COMMUNITY): Payer: Self-pay

## 2021-06-05 ENCOUNTER — Other Ambulatory Visit (HOSPITAL_COMMUNITY): Payer: Self-pay

## 2021-06-05 ENCOUNTER — Other Ambulatory Visit: Payer: Self-pay | Admitting: Internal Medicine

## 2021-06-05 DIAGNOSIS — B20 Human immunodeficiency virus [HIV] disease: Secondary | ICD-10-CM

## 2021-06-06 ENCOUNTER — Other Ambulatory Visit (HOSPITAL_COMMUNITY): Payer: Self-pay

## 2021-06-06 MED ORDER — BIKTARVY 50-200-25 MG PO TABS
1.0000 | ORAL_TABLET | Freq: Every day | ORAL | 1 refills | Status: DC
  Filled 2021-06-06: qty 30, 30d supply, fill #0
  Filled 2021-07-09: qty 30, 30d supply, fill #1

## 2021-07-03 ENCOUNTER — Other Ambulatory Visit (HOSPITAL_COMMUNITY): Payer: Self-pay

## 2021-07-05 ENCOUNTER — Other Ambulatory Visit (HOSPITAL_COMMUNITY): Payer: Self-pay

## 2021-07-09 ENCOUNTER — Other Ambulatory Visit (HOSPITAL_COMMUNITY): Payer: Self-pay

## 2021-07-11 ENCOUNTER — Other Ambulatory Visit: Payer: Self-pay

## 2021-07-11 DIAGNOSIS — Z79899 Other long term (current) drug therapy: Secondary | ICD-10-CM

## 2021-07-11 DIAGNOSIS — B20 Human immunodeficiency virus [HIV] disease: Secondary | ICD-10-CM

## 2021-07-16 ENCOUNTER — Other Ambulatory Visit: Payer: Self-pay

## 2021-07-16 ENCOUNTER — Other Ambulatory Visit: Payer: Medicaid Other

## 2021-07-16 DIAGNOSIS — Z79899 Other long term (current) drug therapy: Secondary | ICD-10-CM

## 2021-07-16 DIAGNOSIS — B20 Human immunodeficiency virus [HIV] disease: Secondary | ICD-10-CM

## 2021-07-17 LAB — T-HELPER CELL (CD4) - (RCID CLINIC ONLY)
CD4 % Helper T Cell: 35 % (ref 33–65)
CD4 T Cell Abs: 1011 /uL (ref 400–1790)

## 2021-07-18 LAB — COMPREHENSIVE METABOLIC PANEL
AG Ratio: 2 (calc) (ref 1.0–2.5)
ALT: 12 U/L (ref 9–46)
AST: 14 U/L (ref 10–40)
Albumin: 4.6 g/dL (ref 3.6–5.1)
Alkaline phosphatase (APISO): 47 U/L (ref 36–130)
BUN: 14 mg/dL (ref 7–25)
CO2: 24 mmol/L (ref 20–32)
Calcium: 9.6 mg/dL (ref 8.6–10.3)
Chloride: 105 mmol/L (ref 98–110)
Creat: 0.96 mg/dL (ref 0.60–1.24)
Globulin: 2.3 g/dL (calc) (ref 1.9–3.7)
Glucose, Bld: 95 mg/dL (ref 65–99)
Potassium: 3.8 mmol/L (ref 3.5–5.3)
Sodium: 140 mmol/L (ref 135–146)
Total Bilirubin: 0.5 mg/dL (ref 0.2–1.2)
Total Protein: 6.9 g/dL (ref 6.1–8.1)

## 2021-07-18 LAB — CBC WITH DIFFERENTIAL/PLATELET
Absolute Monocytes: 563 cells/uL (ref 200–950)
Basophils Absolute: 34 cells/uL (ref 0–200)
Basophils Relative: 0.5 %
Eosinophils Absolute: 60 cells/uL (ref 15–500)
Eosinophils Relative: 0.9 %
HCT: 47.5 % (ref 38.5–50.0)
Hemoglobin: 15.9 g/dL (ref 13.2–17.1)
Lymphs Abs: 3337 cells/uL (ref 850–3900)
MCH: 31.8 pg (ref 27.0–33.0)
MCHC: 33.5 g/dL (ref 32.0–36.0)
MCV: 95 fL (ref 80.0–100.0)
MPV: 9.3 fL (ref 7.5–12.5)
Monocytes Relative: 8.4 %
Neutro Abs: 2707 cells/uL (ref 1500–7800)
Neutrophils Relative %: 40.4 %
Platelets: 197 10*3/uL (ref 140–400)
RBC: 5 10*6/uL (ref 4.20–5.80)
RDW: 12.1 % (ref 11.0–15.0)
Total Lymphocyte: 49.8 %
WBC: 6.7 10*3/uL (ref 3.8–10.8)

## 2021-07-18 LAB — LIPID PANEL
Cholesterol: 193 mg/dL (ref <200)
HDL: 69 mg/dL (ref >=40)
LDL Cholesterol (Calc): 101 mg/dL (calc) — ABNORMAL HIGH
Non-HDL Cholesterol (Calc): 124 mg/dL (calc) (ref <130)
Total CHOL/HDL Ratio: 2.8 (calc) (ref <5.0)
Triglycerides: 131 mg/dL (ref <150)

## 2021-07-18 LAB — HIV-1 RNA QUANT-NO REFLEX-BLD
HIV 1 RNA Quant: <20 Copies/mL — ABNORMAL HIGH
HIV-1 RNA Quant, Log: <1.3 Log cps/mL — ABNORMAL HIGH

## 2021-07-30 ENCOUNTER — Other Ambulatory Visit: Payer: Medicaid Other

## 2021-08-03 ENCOUNTER — Ambulatory Visit: Payer: Medicaid Other | Admitting: Internal Medicine

## 2021-08-07 ENCOUNTER — Other Ambulatory Visit: Payer: Self-pay | Admitting: Internal Medicine

## 2021-08-07 ENCOUNTER — Other Ambulatory Visit (HOSPITAL_COMMUNITY): Payer: Self-pay

## 2021-08-07 DIAGNOSIS — B20 Human immunodeficiency virus [HIV] disease: Secondary | ICD-10-CM

## 2021-08-07 MED ORDER — BIKTARVY 50-200-25 MG PO TABS
1.0000 | ORAL_TABLET | Freq: Every day | ORAL | 5 refills | Status: DC
  Filled 2021-08-07: qty 30, 30d supply, fill #0
  Filled 2021-09-03: qty 30, 30d supply, fill #1
  Filled 2021-09-28: qty 30, 30d supply, fill #2
  Filled 2021-10-31: qty 30, 30d supply, fill #3
  Filled 2021-11-22: qty 30, 30d supply, fill #4
  Filled 2021-12-25: qty 30, 30d supply, fill #5

## 2021-08-09 ENCOUNTER — Other Ambulatory Visit (HOSPITAL_COMMUNITY): Payer: Self-pay

## 2021-08-14 ENCOUNTER — Telehealth (INDEPENDENT_AMBULATORY_CARE_PROVIDER_SITE_OTHER): Payer: Medicaid Other | Admitting: Internal Medicine

## 2021-08-14 ENCOUNTER — Other Ambulatory Visit: Payer: Self-pay

## 2021-08-14 ENCOUNTER — Encounter: Payer: Self-pay | Admitting: Internal Medicine

## 2021-08-14 DIAGNOSIS — B20 Human immunodeficiency virus [HIV] disease: Secondary | ICD-10-CM | POA: Diagnosis not present

## 2021-08-14 NOTE — Progress Notes (Signed)
Virtual Visit via Telephone Note  I connected with [name] on 08/14/21 at  3:30 PM EDT by telephone and verified that I am speaking with the correct person using two identifiers.  Location: Patient: at school Provider: at clinic   I discussed the limitations, risks, security and privacy concerns of performing an evaluation and management service by telephone and the availability of in person appointments. I also discussed with the patient that there may be a patient responsible charge related to this service. The patient expressed understanding and agreed to proceed.   History of Present Illness:  Has been at school for the past 3 weeks. Doing well thus far. He continues to do well taking biktarvy daily. Planning to do 4 wk. In [place] next year which would be a Study abroad elective- [place] and [place] for language immersion. Not sexually active.  Observations/Objective: Fluent speech  Assessment and Plan: HIV disease= continue on biktarvy. Well controlled  Prophylaxis = recommend monkeypox once the vaccine criteria is expanded. Flu vaccine in the Fall. STI  checkat next visit  Follow Up Instructions: Will see 6 months -    I discussed the assessment and treatment plan with the patient. The patient was provided an opportunity to ask questions and all were answered. The patient agreed with the plan and demonstrated an understanding of the instructions.   The patient was advised to call back or seek an in-person evaluation if the symptoms worsen or if the condition fails to improve as anticipated.  I provided 10 minutes of non-face-to-face time during this encounter.   Judyann Munson, MD

## 2021-09-03 ENCOUNTER — Other Ambulatory Visit (HOSPITAL_COMMUNITY): Payer: Self-pay

## 2021-09-28 ENCOUNTER — Other Ambulatory Visit (HOSPITAL_COMMUNITY): Payer: Self-pay

## 2021-10-02 ENCOUNTER — Other Ambulatory Visit (HOSPITAL_COMMUNITY): Payer: Self-pay

## 2021-10-16 ENCOUNTER — Other Ambulatory Visit (HOSPITAL_COMMUNITY): Payer: Self-pay

## 2021-10-21 IMAGING — DX DG CHEST 1V
1 series · 1 of 1 positions shown · non-contrast
Comparison: None.

CLINICAL DATA: 18-year-old male with fever.

EXAM:
CHEST  1 VIEW

[chest ap]
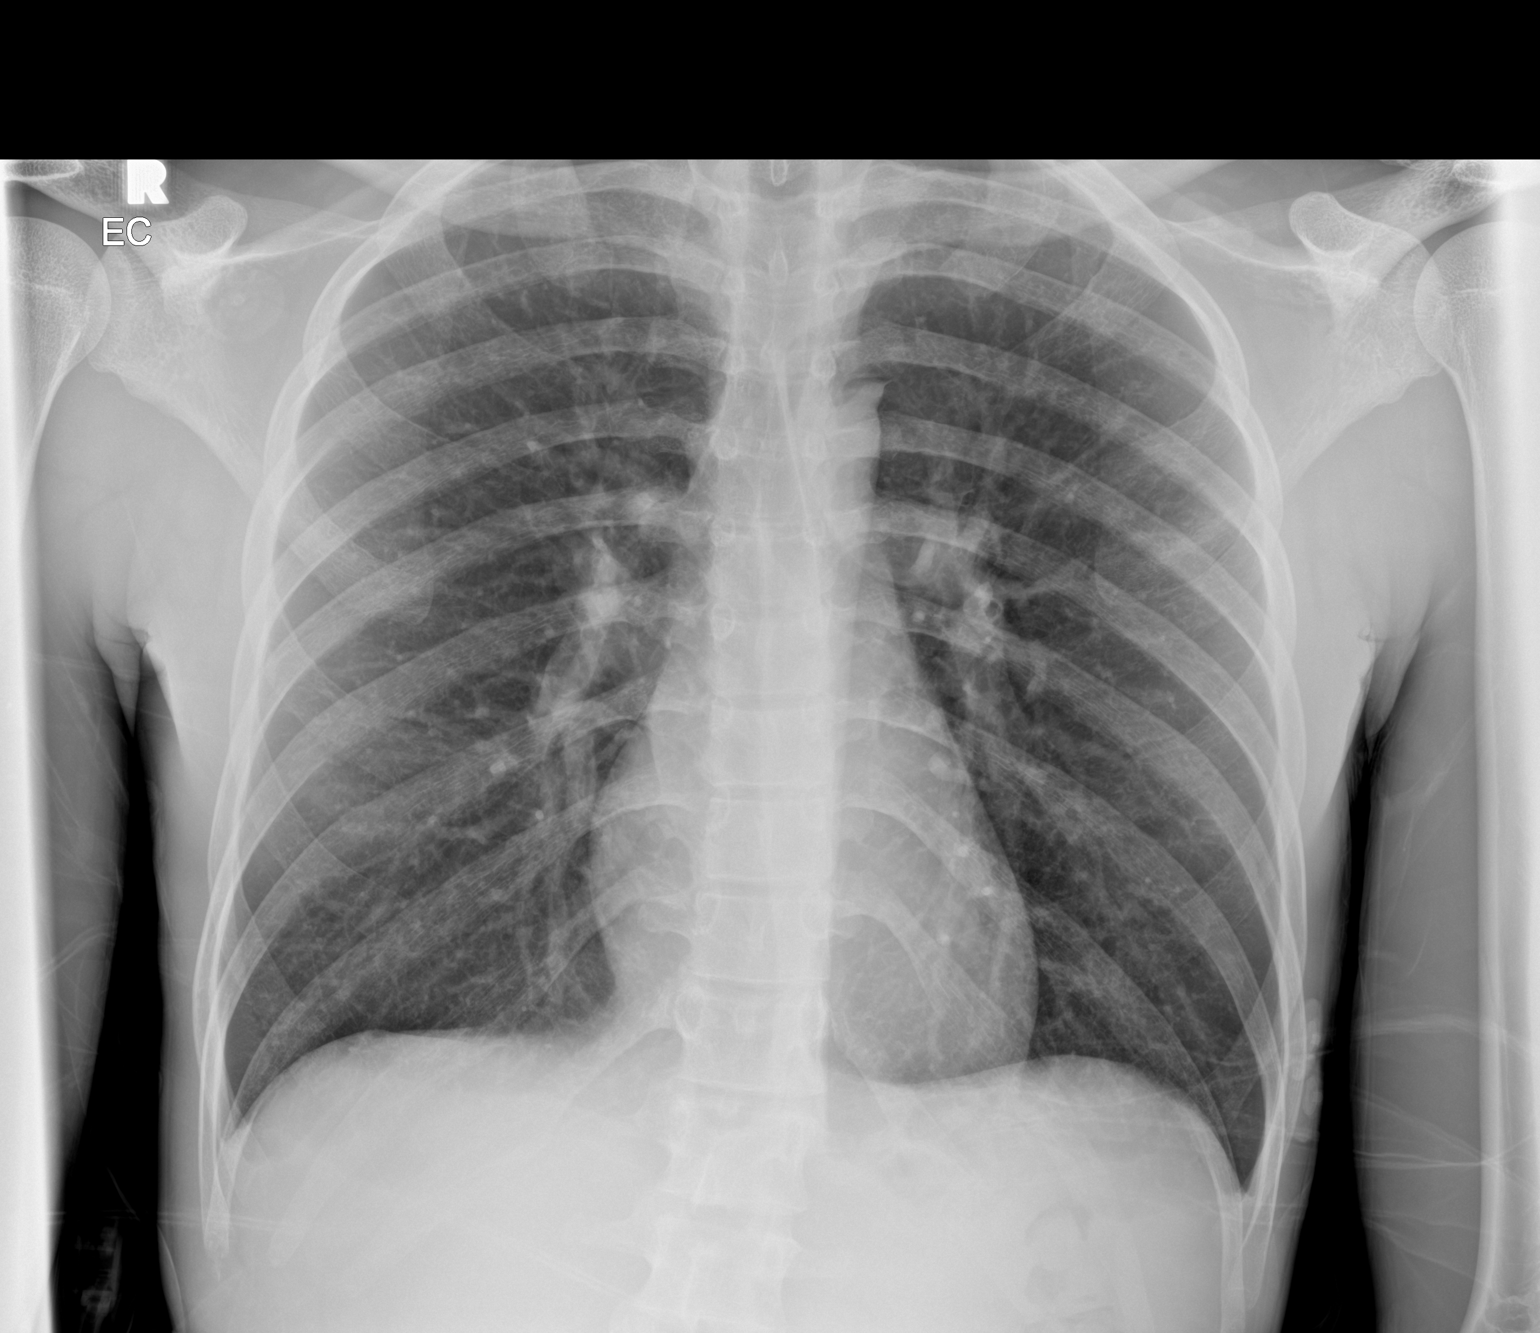

[1 of 1 positions shown; findings below may reference images not displayed]

FINDINGS: The heart size and mediastinal contours are within normal limits.
Both lungs are clear. The visualized skeletal structures are
unremarkable.
IMPRESSION: No active disease.

## 2021-10-22 IMAGING — CT CT HEAD W/O CM
3 of 4 series · 16 of 47 positions shown, 19 images · non-contrast
Comparison: None

CLINICAL DATA: Headache, dizziness

EXAM:
CT HEAD WITHOUT CONTRAST
TECHNIQUE: Contiguous axial images were obtained from the base of the skull
through the vertex without intravenous contrast.

[Series 3: head wo · axial · 0.41mm/px · z∈[-148,-23]mm · 10 of 31 slices shown, 13 images]
[im 3/31  brain]
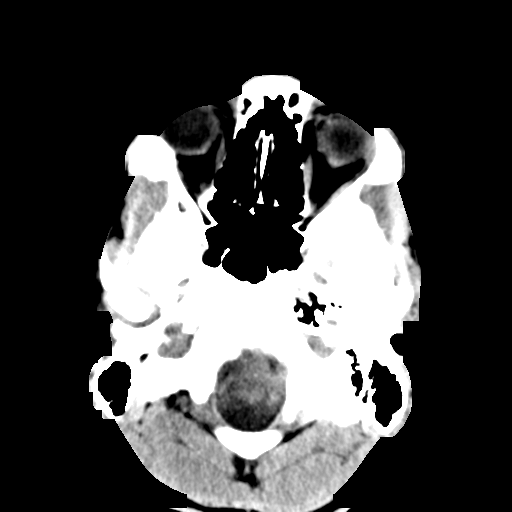
[im 3/31  bone]
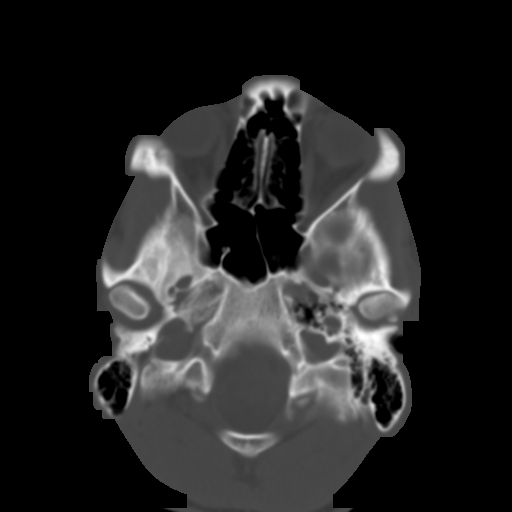
[im 5/31  brain]
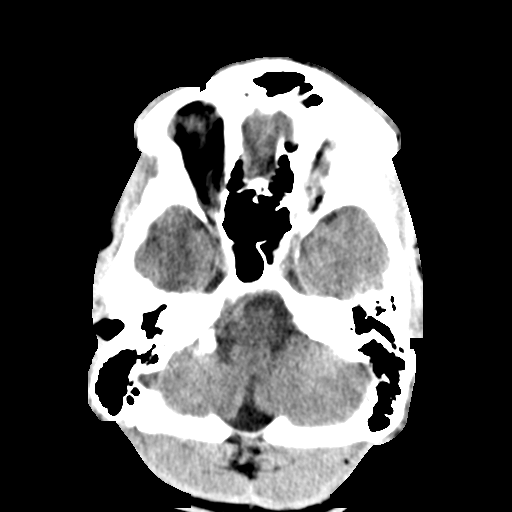
[im 9/31  brain]
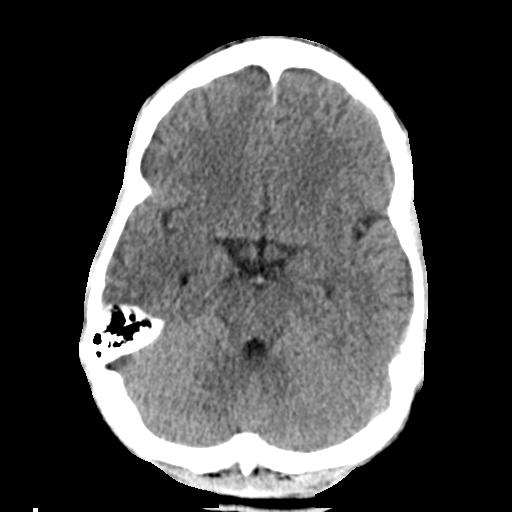
[im 11/31  brain]
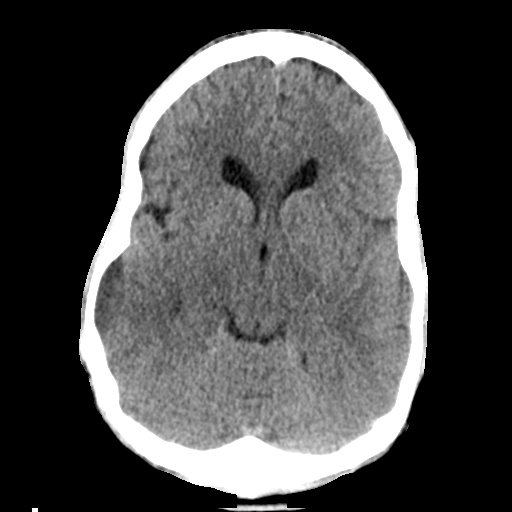
[im 13/31  brain]
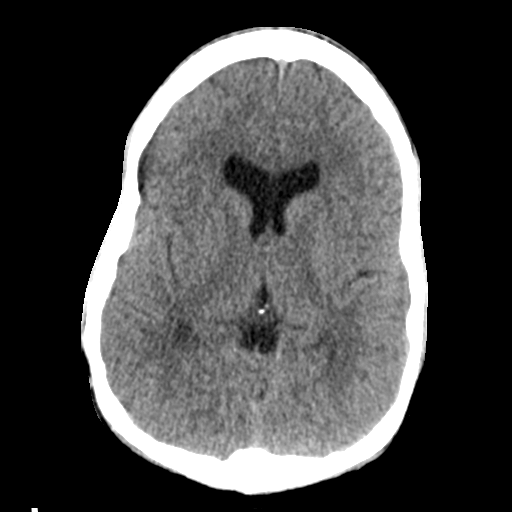
[im 13/31  bone]
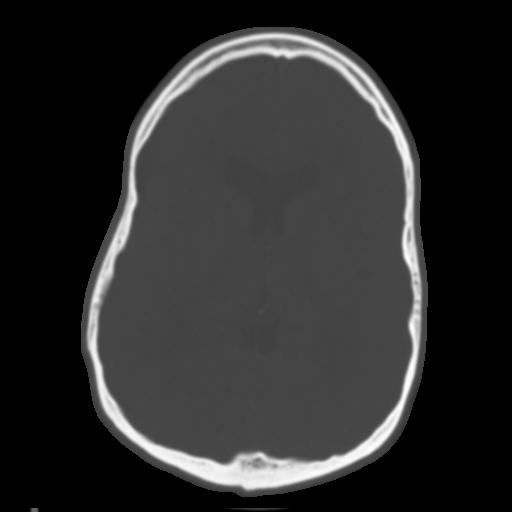
[im 18/31  brain]
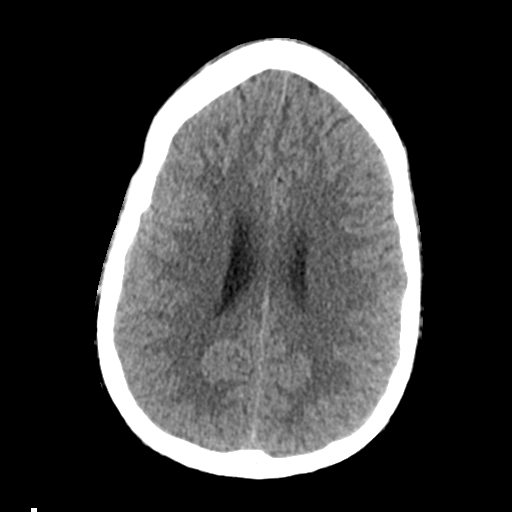
[im 20/31  brain]
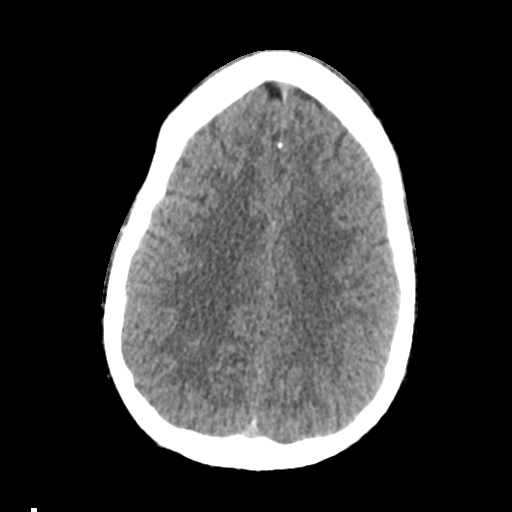
[im 22/31  brain]
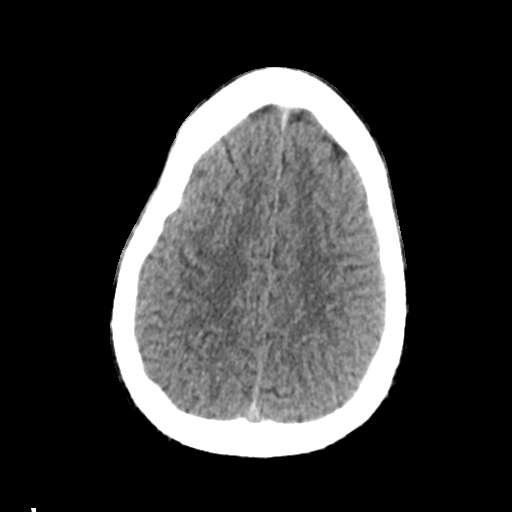
[im 26/31  brain]
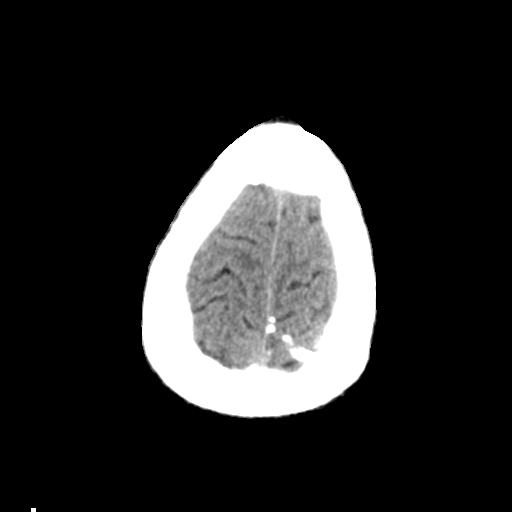
[im 26/31  bone]
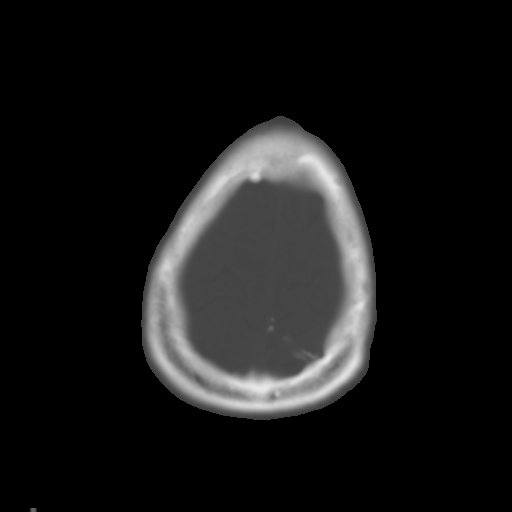
[im 28/31  brain]
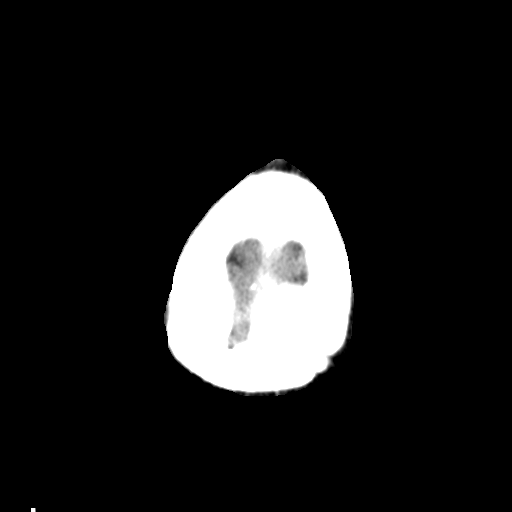

[Series 4: coronal soft tissue · coronal · 0.29mm/px · 3 of 64 slices shown]
[im 22/64  brain]
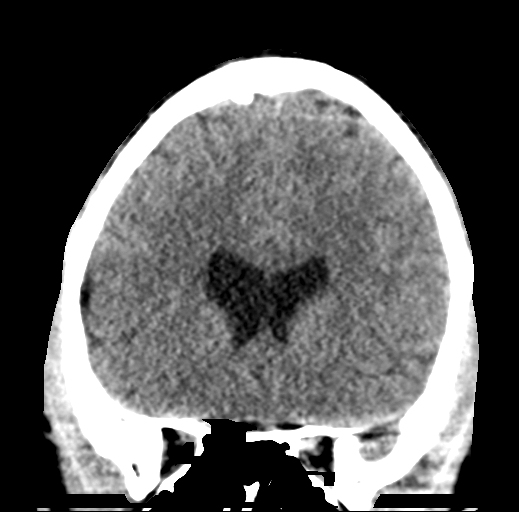
[im 29/64  brain]
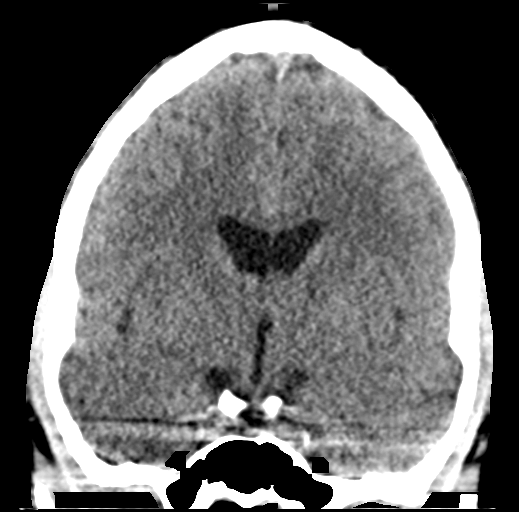
[im 36/64  brain]
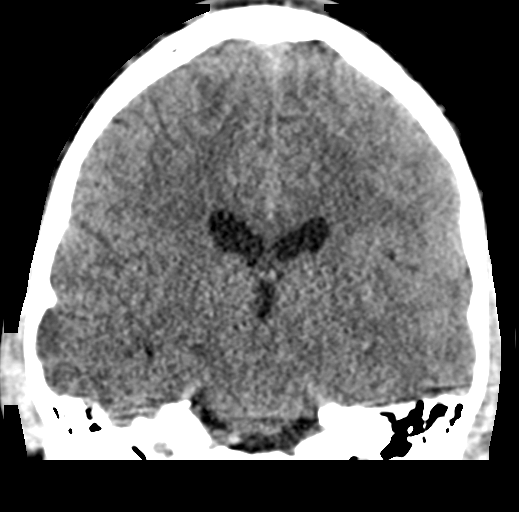

[Series 5: sagittal soft tissue · sagittal · 0.29mm/px · 3 of 51 slices shown]
[im 17/51  brain]
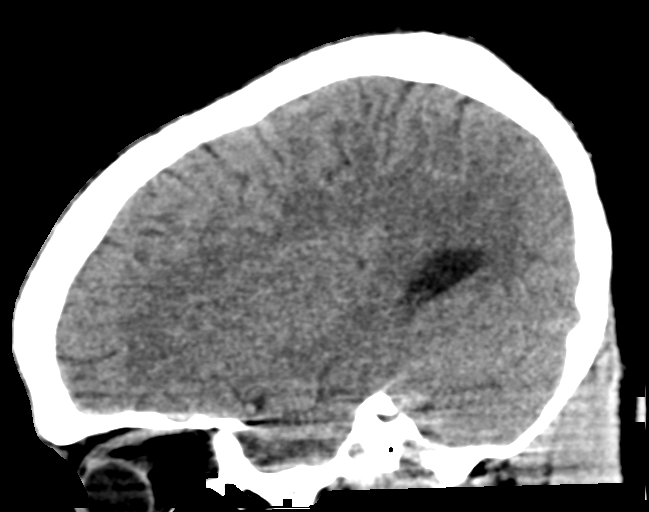
[im 26/51  brain]
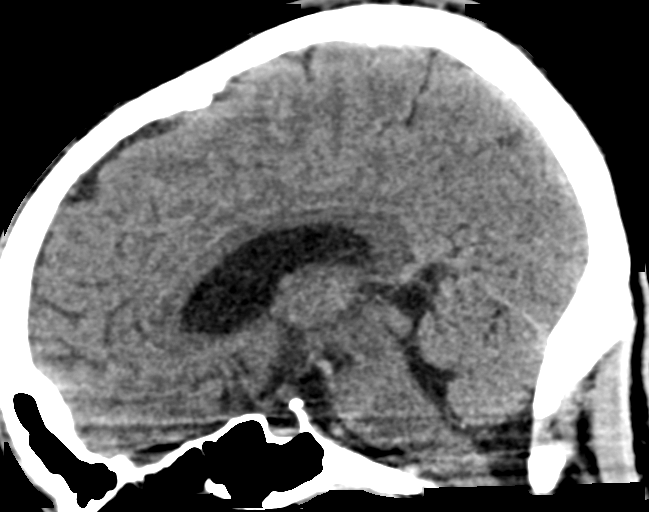
[im 34/51  brain]
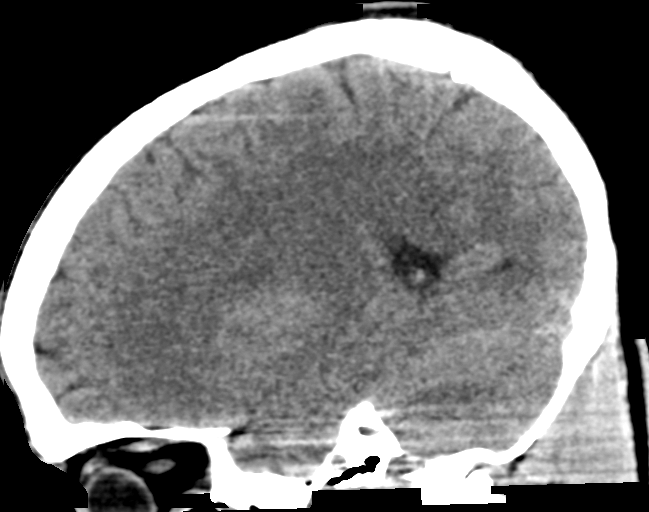

[16 of 47 positions shown; findings below may reference images not displayed]

FINDINGS: Brain: Some beam hardening along the calvarial results in
artifactual hypoattenuation along the cerebral convexities. No
convincing CT evidence of acute infarction, hemorrhage,
hydrocephalus, extra-axial collection or mass lesion/mass effect.
Benign dural calcifications. Midline intracranial structures are
normal. Cerebellar tonsils are normally positioned.

Vascular: No hyperdense vessel or unexpected calcification.

Skull: No calvarial fracture or suspicious osseous lesion. No scalp
swelling or hematoma.

Sinuses/Orbits: Paranasal sinuses and mastoid air cells are
predominantly clear. Included orbital structures are unremarkable.

Other: None
IMPRESSION: No acute intracranial abnormality.

## 2021-10-25 ENCOUNTER — Other Ambulatory Visit (HOSPITAL_COMMUNITY): Payer: Self-pay

## 2021-10-31 ENCOUNTER — Other Ambulatory Visit (HOSPITAL_COMMUNITY): Payer: Self-pay

## 2021-11-05 ENCOUNTER — Other Ambulatory Visit (HOSPITAL_COMMUNITY): Payer: Self-pay

## 2021-11-06 ENCOUNTER — Other Ambulatory Visit (HOSPITAL_COMMUNITY): Payer: Self-pay

## 2021-11-22 ENCOUNTER — Other Ambulatory Visit (HOSPITAL_COMMUNITY): Payer: Self-pay

## 2021-11-28 ENCOUNTER — Other Ambulatory Visit (HOSPITAL_COMMUNITY): Payer: Self-pay

## 2021-12-25 ENCOUNTER — Other Ambulatory Visit (HOSPITAL_COMMUNITY): Payer: Self-pay

## 2021-12-26 ENCOUNTER — Other Ambulatory Visit (HOSPITAL_COMMUNITY): Payer: Self-pay

## 2021-12-31 ENCOUNTER — Encounter: Payer: Self-pay | Admitting: Internal Medicine

## 2022-01-28 ENCOUNTER — Other Ambulatory Visit (HOSPITAL_COMMUNITY): Payer: Self-pay

## 2022-01-28 ENCOUNTER — Other Ambulatory Visit: Payer: Self-pay | Admitting: Internal Medicine

## 2022-01-28 DIAGNOSIS — B20 Human immunodeficiency virus [HIV] disease: Secondary | ICD-10-CM

## 2022-01-28 MED ORDER — BIKTARVY 50-200-25 MG PO TABS
1.0000 | ORAL_TABLET | Freq: Every day | ORAL | 0 refills | Status: DC
  Filled 2022-01-28: qty 30, 30d supply, fill #0

## 2022-01-31 ENCOUNTER — Other Ambulatory Visit (HOSPITAL_COMMUNITY): Payer: Self-pay

## 2022-02-11 ENCOUNTER — Other Ambulatory Visit (HOSPITAL_COMMUNITY): Payer: Self-pay

## 2022-02-11 ENCOUNTER — Ambulatory Visit (LOCAL_COMMUNITY_HEALTH_CENTER): Payer: Medicaid Other

## 2022-02-11 ENCOUNTER — Ambulatory Visit (INDEPENDENT_AMBULATORY_CARE_PROVIDER_SITE_OTHER): Payer: Medicaid Other | Admitting: Internal Medicine

## 2022-02-11 ENCOUNTER — Encounter: Payer: Self-pay | Admitting: Internal Medicine

## 2022-02-11 ENCOUNTER — Other Ambulatory Visit: Payer: Self-pay

## 2022-02-11 VITALS — BP 127/84 | HR 105 | Temp 97.6°F | Wt 127.0 lb

## 2022-02-11 DIAGNOSIS — Z113 Encounter for screening for infections with a predominantly sexual mode of transmission: Secondary | ICD-10-CM

## 2022-02-11 DIAGNOSIS — Z23 Encounter for immunization: Secondary | ICD-10-CM

## 2022-02-11 DIAGNOSIS — B20 Human immunodeficiency virus [HIV] disease: Secondary | ICD-10-CM | POA: Diagnosis not present

## 2022-02-11 MED ORDER — BIKTARVY 50-200-25 MG PO TABS
1.0000 | ORAL_TABLET | Freq: Every day | ORAL | 11 refills | Status: DC
  Filled 2022-02-11 – 2022-02-22 (×2): qty 30, 30d supply, fill #0
  Filled 2022-03-25: qty 30, 30d supply, fill #1
  Filled 2022-04-04: qty 30, 30d supply, fill #2
  Filled 2022-05-20: qty 30, 30d supply, fill #3
  Filled 2022-06-17: qty 30, 30d supply, fill #4
  Filled 2022-07-15: qty 30, 30d supply, fill #5
  Filled 2022-08-14: qty 30, 30d supply, fill #6

## 2022-02-11 NOTE — Progress Notes (Signed)
RFV: follow up for hiv disease  Patient ID: Juan Vaughn, adult   DOB: 2001-08-29, 21 y.o.   MRN: [id number]  HPI 21yo M with HIV disease on biktarvy. Doing well with adherence. He reports he is now In a relationship. Doing well in school and is planning to go to [place] this summer. In [place] - for intermediate spanish for 4 weeks leaves in may 14th.   Outpatient Encounter Medications as of 02/11/2022  Medication Sig   bictegravir-emtricitabine-tenofovir AF (BIKTARVY) 50-200-25 MG TABS tablet TAKE 1 TABLET BY MOUTH DAILY.   No facility-administered encounter medications on file as of 02/11/2022.     Patient Active Problem List   Diagnosis Date Noted   Protein-calorie malnutrition, severe 06/19/2020   Pneumonia 06/18/2020   Acute HIV infection ([facility]) 06/18/2020   Aseptic meningitis 06/18/2020     Health Maintenance Due  Topic Date Due   HPV VACCINES (3 - Risk 3-dose series) 05/15/2021   INFLUENZA VACCINE  07/16/2021   PAP SMEAR-Modifier  Never done     Review of Systems Review of Systems  Constitutional: Negative for fever, chills, diaphoresis, activity change, appetite change, fatigue and unexpected weight change.  HENT: Negative for congestion, sore throat, rhinorrhea, sneezing, trouble swallowing and sinus pressure.  Eyes: Negative for photophobia and visual disturbance.  Respiratory: Negative for cough, chest tightness, shortness of breath, wheezing and stridor.  Cardiovascular: Negative for chest pain, palpitations and leg swelling.  Gastrointestinal: Negative for nausea, vomiting, abdominal pain, diarrhea, constipation, blood in stool, abdominal distention and anal bleeding.  Genitourinary: Negative for dysuria, hematuria, flank pain and difficulty urinating.  Musculoskeletal: Negative for myalgias, back pain, joint swelling, arthralgias and gait problem.  Skin: Negative for color change, pallor, rash and wound.  Neurological: Negative for dizziness, tremors,  weakness and light-headedness.  Hematological: Negative for adenopathy. Does not bruise/bleed easily.  Psychiatric/Behavioral: Negative for behavioral problems, confusion, sleep disturbance, dysphoric mood, decreased concentration and agitation.   Physical Exam   BP 127/84    Pulse (!) 105    Temp 97.6 F (36.4 C) (Temporal)    Wt 127 lb (57.6 kg)    BMI 20.50 kg/m   Physical Exam  Constitutional: He is oriented to person, place, and time. He appears well-developed and well-nourished. No distress.  HENT:  Mouth/Throat: Oropharynx is clear and moist. No oropharyngeal exudate.  Cardiovascular: Normal rate, regular rhythm and normal heart sounds. Exam reveals no gallop and no friction rub.  No murmur heard.  Pulmonary/Chest: Effort normal and breath sounds normal. No respiratory distress. He has no wheezes.  Abdominal: Soft. Bowel sounds are normal. He exhibits no distension. There is no tenderness.  Lymphadenopathy:  He has no cervical adenopathy.  Neurological: He is alert and oriented to person, place, and time.  Skin: Skin is warm and dry. No rash noted. No erythema.  Psychiatric: He has a normal mood and affect. His behavior is normal.   Lab Results  Component Value Date   CD4TCELL 35 07/16/2021   Lab Results  Component Value Date   CD4TABS 1,011 07/16/2021   CD4TABS 650 01/15/2021   CD4TABS 620 10/09/2020   Lab Results  Component Value Date   HIV1RNAQUANT <20 (H) 07/16/2021   No results found for: HEPBSAB Lab Results  Component Value Date   LABRPR NON-REACTIVE 04/27/2021    CBC Lab Results  Component Value Date   WBC 6.7 07/16/2021   RBC 5.00 07/16/2021   HGB 15.9 07/16/2021   HCT 47.5 07/16/2021  PLT 197 07/16/2021   MCV 95.0 07/16/2021   MCH 31.8 07/16/2021   MCHC 33.5 07/16/2021   RDW 12.1 07/16/2021   LYMPHSABS 3,337 07/16/2021   MONOABS 0.2 06/21/2020   EOSABS 60 07/16/2021    BMET Lab Results  Component Value Date   NA 140 07/16/2021   K 3.8  07/16/2021   CL 105 07/16/2021   CO2 24 07/16/2021   GLUCOSE 95 07/16/2021   BUN 14 07/16/2021   CREATININE 0.96 07/16/2021   CALCIUM 9.6 07/16/2021   GFRNONAA 124 01/15/2021   GFRAA 143 01/15/2021      Assessment and Plan HIV disease = will check labs and give refills for biktarvy  Health maintenance = will do sti screening plus also give Hpv #3 off schedule  Long term medication management = will check cr to see if stable

## 2022-02-11 NOTE — Progress Notes (Signed)
In Nurse Clinic requesting Men B and Hep A vaccines today. Pt reports traveling to [place] in 04/2022 for study abroad program. Pt is HIV positive and on Biktarvy.  Phone consult Dr Karyl Kinnier who gives ok for Men B today. Pt meets criteria for SO  Dr Karyl Kinnier for Hep A. RN administered Hep A and Men B without problem. Updated [facility] copy given and explained. Jerel Shepherd, RN

## 2022-02-12 ENCOUNTER — Ambulatory Visit: Payer: Medicaid Other | Admitting: Internal Medicine

## 2022-02-12 LAB — C. TRACHOMATIS/N. GONORRHOEAE RNA
C. trachomatis RNA, TMA: NOT DETECTED
N. gonorrhoeae RNA, TMA: NOT DETECTED

## 2022-02-13 LAB — CBC WITH DIFFERENTIAL/PLATELET
Absolute Monocytes: 640 cells/uL (ref 200–950)
Basophils Absolute: 40 cells/uL (ref 0–200)
Basophils Relative: 0.5 %
Eosinophils Absolute: 48 cells/uL (ref 15–500)
Eosinophils Relative: 0.6 %
HCT: 48.1 % (ref 38.5–50.0)
Hemoglobin: 16.2 g/dL (ref 13.2–17.1)
Lymphs Abs: 2560 cells/uL (ref 850–3900)
MCH: 32 pg (ref 27.0–33.0)
MCHC: 33.7 g/dL (ref 32.0–36.0)
MCV: 95.1 fL (ref 80.0–100.0)
MPV: 9.8 fL (ref 7.5–12.5)
Monocytes Relative: 8 %
Neutro Abs: 4712 cells/uL (ref 1500–7800)
Neutrophils Relative %: 58.9 %
Platelets: 221 10*3/uL (ref 140–400)
RBC: 5.06 10*6/uL (ref 4.20–5.80)
RDW: 11.9 % (ref 11.0–15.0)
Total Lymphocyte: 32 %
WBC: 8 10*3/uL (ref 3.8–10.8)

## 2022-02-13 LAB — COMPLETE METABOLIC PANEL WITH GFR
AG Ratio: 1.7 (calc) (ref 1.0–2.5)
ALT: 14 U/L (ref 9–46)
AST: 17 U/L (ref 10–40)
Albumin: 4.8 g/dL (ref 3.6–5.1)
Alkaline phosphatase (APISO): 50 U/L (ref 36–130)
BUN: 10 mg/dL (ref 7–25)
CO2: 25 mmol/L (ref 20–32)
Calcium: 9.7 mg/dL (ref 8.6–10.3)
Chloride: 103 mmol/L (ref 98–110)
Creat: 0.93 mg/dL (ref 0.60–1.24)
Globulin: 2.8 g/dL (calc) (ref 1.9–3.7)
Glucose, Bld: 84 mg/dL (ref 65–99)
Potassium: 4.1 mmol/L (ref 3.5–5.3)
Sodium: 139 mmol/L (ref 135–146)
Total Bilirubin: 0.6 mg/dL (ref 0.2–1.2)
Total Protein: 7.6 g/dL (ref 6.1–8.1)
eGFR: 120 mL/min/{1.73_m2} (ref >=60)

## 2022-02-13 LAB — HIV-1 RNA QUANT-NO REFLEX-BLD
HIV 1 RNA Quant: <20 Copies/mL — ABNORMAL HIGH
HIV-1 RNA Quant, Log: <1.3 Log cps/mL — ABNORMAL HIGH

## 2022-02-13 LAB — GC/CHLAMYDIA PROBE, AMP (THROAT)
Chlamydia trachomatis RNA: NOT DETECTED
Neisseria gonorrhoeae RNA: NOT DETECTED

## 2022-02-13 LAB — T-HELPER CELLS (CD4) COUNT (NOT AT ARMC)
Absolute CD4: 1045 cells/uL (ref 490–1740)
CD4 T Helper %: 35 % (ref 30–61)
Total lymphocyte count: 3017 cells/uL (ref 850–3900)

## 2022-02-13 LAB — CT/NG RNA, TMA RECTAL
Chlamydia Trachomatis RNA: NOT DETECTED
Neisseria Gonorrhoeae RNA: NOT DETECTED

## 2022-02-13 LAB — RPR: RPR Ser Ql: NONREACTIVE

## 2022-02-20 NOTE — Progress Notes (Signed)
Attestation of Attending Supervision of clinical support staff: I agree with the care provided to this patient and was available for any consultation.  I have reviewed the RN's note and chart. I was available for consult and to see the patient if needed.   Aseem Sessums Niles Deasia Chiu, MD, MPH, ABFM Attending Physician Faculty Practice- [facility]  

## 2022-02-22 ENCOUNTER — Other Ambulatory Visit (HOSPITAL_COMMUNITY): Payer: Self-pay

## 2022-02-27 ENCOUNTER — Other Ambulatory Visit (HOSPITAL_COMMUNITY): Payer: Self-pay

## 2022-03-21 ENCOUNTER — Other Ambulatory Visit (HOSPITAL_COMMUNITY): Payer: Self-pay

## 2022-03-25 ENCOUNTER — Other Ambulatory Visit (HOSPITAL_COMMUNITY): Payer: Self-pay

## 2022-03-28 ENCOUNTER — Other Ambulatory Visit (HOSPITAL_COMMUNITY): Payer: Self-pay

## 2022-04-02 ENCOUNTER — Encounter: Payer: Self-pay | Admitting: Internal Medicine

## 2022-04-04 ENCOUNTER — Other Ambulatory Visit (HOSPITAL_COMMUNITY): Payer: Self-pay

## 2022-04-22 ENCOUNTER — Other Ambulatory Visit (HOSPITAL_COMMUNITY): Payer: Self-pay

## 2022-05-20 ENCOUNTER — Other Ambulatory Visit (HOSPITAL_COMMUNITY): Payer: Self-pay

## 2022-05-23 ENCOUNTER — Other Ambulatory Visit (HOSPITAL_COMMUNITY): Payer: Self-pay

## 2022-05-24 ENCOUNTER — Other Ambulatory Visit (HOSPITAL_COMMUNITY): Payer: Self-pay

## 2022-06-12 ENCOUNTER — Other Ambulatory Visit (HOSPITAL_COMMUNITY): Payer: Self-pay

## 2022-06-17 ENCOUNTER — Other Ambulatory Visit (HOSPITAL_COMMUNITY): Payer: Self-pay

## 2022-06-19 ENCOUNTER — Other Ambulatory Visit (HOSPITAL_COMMUNITY): Payer: Self-pay

## 2022-07-11 ENCOUNTER — Other Ambulatory Visit (HOSPITAL_COMMUNITY): Payer: Self-pay

## 2022-07-15 ENCOUNTER — Other Ambulatory Visit (HOSPITAL_COMMUNITY): Payer: Self-pay

## 2022-07-16 ENCOUNTER — Other Ambulatory Visit (HOSPITAL_COMMUNITY): Payer: Self-pay

## 2022-07-30 ENCOUNTER — Other Ambulatory Visit (HOSPITAL_COMMUNITY): Payer: Self-pay

## 2022-08-13 ENCOUNTER — Ambulatory Visit: Payer: Medicaid Other | Admitting: Internal Medicine

## 2022-08-14 ENCOUNTER — Other Ambulatory Visit (HOSPITAL_COMMUNITY): Payer: Self-pay

## 2022-08-15 ENCOUNTER — Other Ambulatory Visit (HOSPITAL_COMMUNITY)
Admission: RE | Admit: 2022-08-15 | Discharge: 2022-08-15 | Disposition: A | Discharge status: Home / Self Care | Payer: Medicaid Other | Source: Ambulatory Visit | Attending: Internal Medicine | Admitting: Internal Medicine

## 2022-08-15 ENCOUNTER — Encounter: Payer: Self-pay | Admitting: Internal Medicine

## 2022-08-15 ENCOUNTER — Other Ambulatory Visit: Payer: Self-pay

## 2022-08-15 ENCOUNTER — Ambulatory Visit (INDEPENDENT_AMBULATORY_CARE_PROVIDER_SITE_OTHER): Payer: Medicaid Other | Admitting: Internal Medicine

## 2022-08-15 ENCOUNTER — Other Ambulatory Visit (HOSPITAL_COMMUNITY): Payer: Self-pay

## 2022-08-15 VITALS — BP 139/75 | HR 90 | Temp 97.9°F | Ht 66.0 in | Wt 127.0 lb

## 2022-08-15 DIAGNOSIS — B20 Human immunodeficiency virus [HIV] disease: Secondary | ICD-10-CM

## 2022-08-15 DIAGNOSIS — Z79899 Other long term (current) drug therapy: Secondary | ICD-10-CM

## 2022-08-15 MED ORDER — BIKTARVY 50-200-25 MG PO TABS
1.0000 | ORAL_TABLET | Freq: Every day | ORAL | 11 refills | Status: DC
  Filled 2022-08-15 – 2022-09-16 (×2): qty 30, 30d supply, fill #0
  Filled 2022-10-09: qty 30, 30d supply, fill #1
  Filled 2022-11-13: qty 30, 30d supply, fill #2
  Filled 2022-12-10: qty 30, 30d supply, fill #3
  Filled 2022-12-30: qty 30, 30d supply, fill #4
  Filled 2023-01-29: qty 30, 30d supply, fill #5
  Filled 2023-03-03: qty 30, 30d supply, fill #6
  Filled 2023-04-08: qty 30, 30d supply, fill #7
  Filled 2023-05-09: qty 30, 30d supply, fill #8
  Filled 2023-06-06: qty 30, 30d supply, fill #9
  Filled 2023-07-08: qty 30, 30d supply, fill #10
  Filled 2023-08-04: qty 30, 30d supply, fill #11

## 2022-08-15 NOTE — Progress Notes (Signed)
RFV: follow up for hiv disease  77month visit  Patient ID: Juan Vaughn, adult   DOB: March 08, 2001, 21 y.o.   MRN: [id number]  HPI 21yo M with well controlled hiv disease. 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 No issues with adherence.now in 2nd in school finishes in may 2023. He Went to [place] for 1 month for school session. Did well speaking spanish, did not miss dose of medicines. No Unprotected sex. No longer drinking alcohol. Looking forward to this year in school.  Outpatient Encounter Medications as of 08/15/2022  Medication Sig   bictegravir-emtricitabine-tenofovir AF (BIKTARVY) 50-200-25 MG TABS tablet TAKE 1 TABLET BY MOUTH DAILY.   No facility-administered encounter medications on file as of 08/15/2022.     Patient Active Problem List   Diagnosis Date Noted   Protein-calorie malnutrition, severe 06/19/2020   Pneumonia 06/18/2020   Acute HIV infection ([facility]) 06/18/2020   Aseptic meningitis 06/18/2020     Health Maintenance Due  Topic Date Due   PAP SMEAR-Modifier  Never done   INFLUENZA VACCINE  07/16/2022     Review of Systems Review of Systems  Constitutional: Negative for fever, chills, diaphoresis, activity change, appetite change, fatigue and unexpected weight change.  HENT: Negative for congestion, sore throat, rhinorrhea, sneezing, trouble swallowing and sinus pressure.  Eyes: Negative for photophobia and visual disturbance.  Respiratory: Negative for cough, chest tightness, shortness of breath, wheezing and stridor.  Cardiovascular: Negative for chest pain, palpitations and leg swelling.  Gastrointestinal: Negative for nausea, vomiting, abdominal pain, diarrhea, constipation, blood in stool, abdominal distention and anal bleeding.  Genitourinary: Negative for dysuria, hematuria, flank pain and difficulty urinating.  Musculoskeletal: Negative for myalgias, back pain, joint swelling, arthralgias and gait problem.  Skin: Negative for color change, pallor, rash and  wound.  Neurological: Negative for dizziness, tremors, weakness and light-headedness.  Hematological: Negative for adenopathy. Does not bruise/bleed easily.  Psychiatric/Behavioral: Negative for behavioral problems, confusion, sleep disturbance, dysphoric mood, decreased concentration and agitation.   Physical Exam   BP 139/75   Pulse 90   Temp 97.9 F (36.6 C) (Oral)   Ht 5\' 6"  (1.676 m)   Wt 127 lb (57.6 kg)   SpO2 100%   BMI 20.50 kg/m   Physical Exam  Constitutional: He is oriented to person, place, and time. He appears well-developed and well-nourished. No distress.  HENT:  Mouth/Throat: Oropharynx is clear and moist. No oropharyngeal exudate.  Cardiovascular: Normal rate, regular rhythm and normal heart sounds. Exam reveals no gallop and no friction rub.  No murmur heard.  Pulmonary/Chest: Effort normal and breath sounds normal. No respiratory distress. He has no wheezes.  Abdominal: Soft. Bowel sounds are normal. He exhibits no distension. There is no tenderness.  Lymphadenopathy:  He has no cervical adenopathy.  Neurological: He is alert and oriented to person, place, and time.  Skin: Skin is warm and dry. No rash noted. No erythema.  Psychiatric: He has a normal mood and affect. His behavior is normal.   Lab Results  Component Value Date   CD4TCELL 35 02/11/2022   Lab Results  Component Value Date   CD4TABS 1,011 07/16/2021   CD4TABS 650 01/15/2021   CD4TABS 620 10/09/2020   Lab Results  Component Value Date   HIV1RNAQUANT <20 (H) 02/11/2022   No results found for: "HEPBSAB" Lab Results  Component Value Date   LABRPR NON-REACTIVE 02/11/2022    CBC Lab Results  Component Value Date   WBC 8.0 02/11/2022   RBC 5.06 02/11/2022  HGB 16.2 02/11/2022   HCT 48.1 02/11/2022   PLT 221 02/11/2022   MCV 95.1 02/11/2022   MCH 32.0 02/11/2022   MCHC 33.7 02/11/2022   RDW 11.9 02/11/2022   LYMPHSABS 2,560 02/11/2022   MONOABS 0.2 06/21/2020   EOSABS 48  02/11/2022    BMET Lab Results  Component Value Date   NA 139 02/11/2022   K 4.1 02/11/2022   CL 103 02/11/2022   CO2 25 02/11/2022   GLUCOSE 84 02/11/2022   BUN 10 02/11/2022   CREATININE 0.93 02/11/2022   CALCIUM 9.7 02/11/2022   GFRNONAA 124 01/15/2021   GFRAA 143 01/15/2021      Assessment and Plan   Hiv disease = will get labs today, since he has excellent adherence, likely to see be undetectable. Will check cd 4 count and VL and provide refillgs  Health maintenance = will check lipid profile and Will do sti screen  Long term medication management = will check cr function

## 2022-08-16 LAB — URINE CYTOLOGY ANCILLARY ONLY
Chlamydia: NEGATIVE
Comment: NEGATIVE
Comment: NORMAL
Neisseria Gonorrhea: NEGATIVE

## 2022-08-16 LAB — T-HELPER CELL (CD4) - (RCID CLINIC ONLY)
CD4 % Helper T Cell: 42 % (ref 33–65)
CD4 T Cell Abs: 898 /uL (ref 400–1790)

## 2022-08-18 LAB — COMPLETE METABOLIC PANEL WITH GFR
AG Ratio: 2.2 (calc) (ref 1.0–2.5)
ALT: 16 U/L (ref 9–46)
AST: 17 U/L (ref 10–40)
Albumin: 4.9 g/dL (ref 3.6–5.1)
Alkaline phosphatase (APISO): 51 U/L (ref 36–130)
BUN: 15 mg/dL (ref 7–25)
CO2: 27 mmol/L (ref 20–32)
Calcium: 9.8 mg/dL (ref 8.6–10.3)
Chloride: 102 mmol/L (ref 98–110)
Creat: 1.04 mg/dL (ref 0.60–1.24)
Globulin: 2.2 g/dL (calc) (ref 1.9–3.7)
Glucose, Bld: 104 mg/dL — ABNORMAL HIGH (ref 65–99)
Potassium: 3.9 mmol/L (ref 3.5–5.3)
Sodium: 139 mmol/L (ref 135–146)
Total Bilirubin: 0.5 mg/dL (ref 0.2–1.2)
Total Protein: 7.1 g/dL (ref 6.1–8.1)
eGFR: 105 mL/min/{1.73_m2} (ref >=60)

## 2022-08-18 LAB — CBC WITH DIFFERENTIAL/PLATELET
Absolute Monocytes: 485 cells/uL (ref 200–950)
Basophils Absolute: 40 cells/uL (ref 0–200)
Basophils Relative: 0.7 %
Eosinophils Absolute: 40 cells/uL (ref 15–500)
Eosinophils Relative: 0.7 %
HCT: 48 % (ref 38.5–50.0)
Hemoglobin: 16.2 g/dL (ref 13.2–17.1)
Lymphs Abs: 2388 cells/uL (ref 850–3900)
MCH: 32.1 pg (ref 27.0–33.0)
MCHC: 33.8 g/dL (ref 32.0–36.0)
MCV: 95.2 fL (ref 80.0–100.0)
MPV: 9.3 fL (ref 7.5–12.5)
Monocytes Relative: 8.5 %
Neutro Abs: 2747 cells/uL (ref 1500–7800)
Neutrophils Relative %: 48.2 %
Platelets: 196 10*3/uL (ref 140–400)
RBC: 5.04 10*6/uL (ref 4.20–5.80)
RDW: 12.1 % (ref 11.0–15.0)
Total Lymphocyte: 41.9 %
WBC: 5.7 10*3/uL (ref 3.8–10.8)

## 2022-08-18 LAB — LIPID PANEL
Cholesterol: 199 mg/dL (ref <200)
HDL: 72 mg/dL (ref >=40)
LDL Cholesterol (Calc): 102 mg/dL (calc) — ABNORMAL HIGH
Non-HDL Cholesterol (Calc): 127 mg/dL (calc) (ref <130)
Total CHOL/HDL Ratio: 2.8 (calc) (ref <5.0)
Triglycerides: 152 mg/dL — ABNORMAL HIGH (ref <150)

## 2022-08-18 LAB — HIV-1 RNA QUANT-NO REFLEX-BLD
HIV 1 RNA Quant: 27 Copies/mL — ABNORMAL HIGH
HIV-1 RNA Quant, Log: 1.44 Log cps/mL — ABNORMAL HIGH

## 2022-08-18 LAB — RPR: RPR Ser Ql: NONREACTIVE

## 2022-09-12 ENCOUNTER — Other Ambulatory Visit (HOSPITAL_COMMUNITY): Payer: Self-pay

## 2022-09-16 ENCOUNTER — Other Ambulatory Visit (HOSPITAL_COMMUNITY): Payer: Self-pay

## 2022-10-09 ENCOUNTER — Other Ambulatory Visit (HOSPITAL_COMMUNITY): Payer: Self-pay

## 2022-10-11 LAB — HSV DNA BY PCR (REFERENCE LAB)
HSV 1 DNA: NEGATIVE
HSV 2 DNA: NEGATIVE

## 2022-10-14 ENCOUNTER — Other Ambulatory Visit (HOSPITAL_COMMUNITY): Payer: Self-pay

## 2022-11-12 ENCOUNTER — Other Ambulatory Visit (HOSPITAL_COMMUNITY): Payer: Self-pay

## 2022-11-13 ENCOUNTER — Other Ambulatory Visit (HOSPITAL_COMMUNITY): Payer: Self-pay

## 2022-11-15 ENCOUNTER — Other Ambulatory Visit (HOSPITAL_COMMUNITY): Payer: Self-pay

## 2022-11-19 ENCOUNTER — Other Ambulatory Visit (HOSPITAL_COMMUNITY): Payer: Self-pay

## 2022-12-03 ENCOUNTER — Other Ambulatory Visit (HOSPITAL_COMMUNITY): Payer: Self-pay

## 2022-12-05 ENCOUNTER — Other Ambulatory Visit: Payer: Self-pay

## 2022-12-10 ENCOUNTER — Other Ambulatory Visit: Payer: Self-pay

## 2022-12-30 ENCOUNTER — Other Ambulatory Visit (HOSPITAL_COMMUNITY): Payer: Self-pay

## 2023-01-03 ENCOUNTER — Other Ambulatory Visit (HOSPITAL_COMMUNITY): Payer: Self-pay

## 2023-01-29 ENCOUNTER — Other Ambulatory Visit (HOSPITAL_COMMUNITY): Payer: Self-pay

## 2023-01-30 ENCOUNTER — Other Ambulatory Visit: Payer: Self-pay

## 2023-02-13 ENCOUNTER — Other Ambulatory Visit (HOSPITAL_COMMUNITY): Payer: Self-pay

## 2023-03-03 ENCOUNTER — Other Ambulatory Visit (HOSPITAL_COMMUNITY): Payer: Self-pay

## 2023-03-11 ENCOUNTER — Other Ambulatory Visit: Payer: Self-pay

## 2023-03-24 ENCOUNTER — Ambulatory Visit: Payer: Medicaid Other | Admitting: Internal Medicine

## 2023-04-08 ENCOUNTER — Other Ambulatory Visit (HOSPITAL_COMMUNITY): Payer: Self-pay

## 2023-04-10 ENCOUNTER — Other Ambulatory Visit (HOSPITAL_COMMUNITY): Payer: Self-pay

## 2023-04-14 ENCOUNTER — Other Ambulatory Visit: Payer: Self-pay

## 2023-04-14 DIAGNOSIS — B20 Human immunodeficiency virus [HIV] disease: Secondary | ICD-10-CM

## 2023-04-14 DIAGNOSIS — Z113 Encounter for screening for infections with a predominantly sexual mode of transmission: Secondary | ICD-10-CM

## 2023-04-14 DIAGNOSIS — Z79899 Other long term (current) drug therapy: Secondary | ICD-10-CM

## 2023-04-15 ENCOUNTER — Other Ambulatory Visit: Payer: Medicaid Other

## 2023-04-15 ENCOUNTER — Other Ambulatory Visit (HOSPITAL_COMMUNITY)
Admission: RE | Admit: 2023-04-15 | Discharge: 2023-04-15 | Disposition: A | Discharge status: Home / Self Care | Payer: Medicaid Other | Source: Ambulatory Visit | Attending: Internal Medicine | Admitting: Internal Medicine

## 2023-04-15 ENCOUNTER — Other Ambulatory Visit: Payer: Self-pay

## 2023-04-15 DIAGNOSIS — Z113 Encounter for screening for infections with a predominantly sexual mode of transmission: Secondary | ICD-10-CM

## 2023-04-15 DIAGNOSIS — B20 Human immunodeficiency virus [HIV] disease: Secondary | ICD-10-CM

## 2023-04-15 DIAGNOSIS — Z79899 Other long term (current) drug therapy: Secondary | ICD-10-CM

## 2023-04-16 LAB — URINE CYTOLOGY ANCILLARY ONLY
Chlamydia: NEGATIVE
Comment: NEGATIVE
Comment: NORMAL
Neisseria Gonorrhea: NEGATIVE

## 2023-04-16 LAB — LIPID PANEL
Cholesterol: 200 mg/dL — ABNORMAL HIGH (ref <200)
LDL Cholesterol (Calc): 101 mg/dL (calc) — ABNORMAL HIGH
Non-HDL Cholesterol (Calc): 133 mg/dL (calc) — ABNORMAL HIGH (ref <130)

## 2023-04-16 LAB — CBC WITH DIFFERENTIAL/PLATELET
Platelets: 215 10*3/uL (ref 140–400)
RBC: 5.12 10*6/uL (ref 4.20–5.80)
RDW: 12 % (ref 11.0–15.0)
WBC: 5.1 10*3/uL (ref 3.8–10.8)

## 2023-04-16 LAB — COMPLETE METABOLIC PANEL WITH GFR
ALT: 14 U/L (ref 9–46)
Calcium: 9.8 mg/dL (ref 8.6–10.3)
Globulin: 2.5 g/dL (calc) (ref 1.9–3.7)
Sodium: 137 mmol/L (ref 135–146)

## 2023-04-16 LAB — T-HELPER CELL (CD4) - (RCID CLINIC ONLY)
CD4 % Helper T Cell: 42 % (ref 33–65)
CD4 T Cell Abs: 1076 /uL (ref 400–1790)

## 2023-04-16 LAB — RPR: RPR Ser Ql: NONREACTIVE

## 2023-04-18 LAB — CBC WITH DIFFERENTIAL/PLATELET
Absolute Monocytes: 383 cells/uL (ref 200–950)
Basophils Absolute: 31 cells/uL (ref 0–200)
Basophils Relative: 0.6 %
Eosinophils Absolute: 51 cells/uL (ref 15–500)
Eosinophils Relative: 1 %
HCT: 48.4 % (ref 38.5–50.0)
Hemoglobin: 16.7 g/dL (ref 13.2–17.1)
Lymphs Abs: 2841 cells/uL (ref 850–3900)
MCH: 32.6 pg (ref 27.0–33.0)
MCHC: 34.5 g/dL (ref 32.0–36.0)
MCV: 94.5 fL (ref 80.0–100.0)
MPV: 9.4 fL (ref 7.5–12.5)
Monocytes Relative: 7.5 %
Neutro Abs: 1795 cells/uL (ref 1500–7800)
Neutrophils Relative %: 35.2 %
Total Lymphocyte: 55.7 %

## 2023-04-18 LAB — LIPID PANEL
HDL: 67 mg/dL (ref >=40)
Total CHOL/HDL Ratio: 3 (calc) (ref <5.0)
Triglycerides: 206 mg/dL — ABNORMAL HIGH (ref <150)

## 2023-04-18 LAB — COMPLETE METABOLIC PANEL WITH GFR
AG Ratio: 2 (calc) (ref 1.0–2.5)
AST: 16 U/L (ref 10–40)
Albumin: 4.9 g/dL (ref 3.6–5.1)
Alkaline phosphatase (APISO): 42 U/L (ref 36–130)
BUN: 9 mg/dL (ref 7–25)
CO2: 26 mmol/L (ref 20–32)
Chloride: 101 mmol/L (ref 98–110)
Creat: 0.86 mg/dL (ref 0.60–1.24)
Glucose, Bld: 78 mg/dL (ref 65–99)
Potassium: 3.9 mmol/L (ref 3.5–5.3)
Total Bilirubin: 0.7 mg/dL (ref 0.2–1.2)
Total Protein: 7.4 g/dL (ref 6.1–8.1)
eGFR: 126 mL/min/{1.73_m2} (ref >=60)

## 2023-04-18 LAB — HIV-1 RNA QUANT-NO REFLEX-BLD
HIV 1 RNA Quant: 25 Copies/mL — ABNORMAL HIGH
HIV-1 RNA Quant, Log: 1.4 Log cps/mL — ABNORMAL HIGH

## 2023-04-28 ENCOUNTER — Other Ambulatory Visit: Payer: Self-pay

## 2023-04-28 ENCOUNTER — Telehealth (INDEPENDENT_AMBULATORY_CARE_PROVIDER_SITE_OTHER): Payer: Medicaid Other | Admitting: Internal Medicine

## 2023-04-28 DIAGNOSIS — Z79899 Other long term (current) drug therapy: Secondary | ICD-10-CM | POA: Diagnosis not present

## 2023-04-28 DIAGNOSIS — B20 Human immunodeficiency virus [HIV] disease: Secondary | ICD-10-CM

## 2023-04-28 NOTE — Progress Notes (Signed)
RFV; follow up for hiv disease -virtual video visit  Virtual Visit via Video Note  I connected with [name] on 05/08/23 at 11:30 AM EDT by a video enabled telemedicine application and verified that I am speaking with the correct person using two identifiers.  Location: Patient: at home Provider: in clinic   I discussed the limitations of evaluation and management by telemedicine and the availability of in person appointments. The patient expressed understanding and agreed to proceed.  Patient ID: Juan Vaughn, adult   DOB: [birth year], 22 y.o.   MRN: [id number]  HPI Juan Vaughn is a 22yo M with well controlled hiv disease, taking meds daily not missing a dose. Not having any issues with it. He is working as an Librarian, academic and enjoying it. No health concerns since we last saw him  Outpatient Encounter Medications as of 04/28/2023  Medication Sig   bictegravir-emtricitabine-tenofovir AF (BIKTARVY) 50-200-25 MG TABS tablet TAKE 1 TABLET BY MOUTH DAILY.   No facility-administered encounter medications on file as of 04/28/2023.     Patient Active Problem List   Diagnosis Date Noted   Protein-calorie malnutrition, severe 06/19/2020   Pneumonia 06/18/2020   Acute HIV infection ([facility]) 06/18/2020   Aseptic meningitis 06/18/2020     Health Maintenance Due  Topic Date Due   COVID-19 Vaccine (1) Never done   PAP SMEAR-Modifier  Never done     Review of Systems Review of Systems  Constitutional: Negative for fever, chills, diaphoresis, activity change, appetite change, fatigue and unexpected weight change.  HENT: Negative for congestion, sore throat, rhinorrhea, sneezing, trouble swallowing and sinus pressure.  Eyes: Negative for photophobia and visual disturbance.  Respiratory: Negative for cough, chest tightness, shortness of breath, wheezing and stridor.  Cardiovascular: Negative for chest pain, palpitations and leg swelling.  Gastrointestinal: Negative for nausea, vomiting,  abdominal pain, diarrhea, constipation, blood in stool, abdominal distention and anal bleeding.  Genitourinary: Negative for dysuria, hematuria, flank pain and difficulty urinating.  Musculoskeletal: Negative for myalgias, back pain, joint swelling, arthralgias and gait problem.  Skin: Negative for color change, pallor, rash and wound.  Neurological: Negative for dizziness, tremors, weakness and light-headedness.  Hematological: Negative for adenopathy. Does not bruise/bleed easily.  Psychiatric/Behavioral: Negative for behavioral problems, confusion, sleep disturbance, dysphoric mood, decreased concentration and agitation.   Physical Exam   Gen = a xo by 3  HEENT = PERRLA, EOMI, no scleral icterus Psych = appropriate demeaner   Lab Results  Component Value Date   CD4TCELL 42 04/15/2023   Lab Results  Component Value Date   CD4TABS 1,076 04/15/2023   CD4TABS 898 08/15/2022   CD4TABS 1,011 07/16/2021   Lab Results  Component Value Date   HIV1RNAQUANT 25 (H) 04/15/2023   No results found for: "HEPBSAB" Lab Results  Component Value Date   LABRPR NON-REACTIVE 04/15/2023    CBC Lab Results  Component Value Date   WBC 5.1 04/15/2023   RBC 5.12 04/15/2023   HGB 16.7 04/15/2023   HCT 48.4 04/15/2023   PLT 215 04/15/2023   MCV 94.5 04/15/2023   MCH 32.6 04/15/2023   MCHC 34.5 04/15/2023   RDW 12.0 04/15/2023   LYMPHSABS 2,841 04/15/2023   MONOABS 0.2 06/21/2020   EOSABS 51 04/15/2023    BMET Lab Results  Component Value Date   NA 137 04/15/2023   K 3.9 04/15/2023   CL 101 04/15/2023   CO2 26 04/15/2023   GLUCOSE 78 04/15/2023   BUN 9 04/15/2023   CREATININE 0.86 04/15/2023  CALCIUM 9.8 04/15/2023   GFRNONAA 124 01/15/2021   GFRAA 143 01/15/2021     Assessment and Plan:  Hiv disease = continue on biktarvy. Remains well controlled. Will give refills  Long term medication management = lab reveal stable  At next visit - will do sti testing and anal  pap Follow Up Instructions:    I discussed the assessment and treatment plan with the patient. The patient was provided an opportunity to ask questions and all were answered. The patient agreed with the plan and demonstrated an understanding of the instructions.   The patient was advised to call back or seek an in-person evaluation if the symptoms worsen or if the condition fails to improve as anticipated.  I provided 20 minutes of non-face-to-face time during this encounter.   Judyann Munson, MD

## 2023-05-08 ENCOUNTER — Other Ambulatory Visit (HOSPITAL_COMMUNITY): Payer: Self-pay

## 2023-05-09 ENCOUNTER — Other Ambulatory Visit (HOSPITAL_COMMUNITY): Payer: Self-pay

## 2023-05-13 ENCOUNTER — Other Ambulatory Visit (HOSPITAL_COMMUNITY): Payer: Self-pay

## 2023-05-14 ENCOUNTER — Other Ambulatory Visit (HOSPITAL_COMMUNITY): Payer: Self-pay

## 2023-06-06 ENCOUNTER — Other Ambulatory Visit: Payer: Self-pay

## 2023-06-10 ENCOUNTER — Other Ambulatory Visit (HOSPITAL_COMMUNITY): Payer: Self-pay

## 2023-07-08 ENCOUNTER — Other Ambulatory Visit (HOSPITAL_COMMUNITY): Payer: Self-pay

## 2023-07-11 ENCOUNTER — Other Ambulatory Visit: Payer: Self-pay

## 2023-07-31 ENCOUNTER — Other Ambulatory Visit (HOSPITAL_COMMUNITY): Payer: Self-pay

## 2023-08-04 ENCOUNTER — Other Ambulatory Visit (HOSPITAL_COMMUNITY): Payer: Self-pay

## 2023-08-11 ENCOUNTER — Other Ambulatory Visit (HOSPITAL_COMMUNITY): Payer: Self-pay

## 2023-09-02 ENCOUNTER — Other Ambulatory Visit (HOSPITAL_COMMUNITY): Payer: Self-pay

## 2023-09-02 ENCOUNTER — Other Ambulatory Visit: Payer: Self-pay | Admitting: Internal Medicine

## 2023-09-02 ENCOUNTER — Other Ambulatory Visit: Payer: Self-pay

## 2023-09-02 DIAGNOSIS — B20 Human immunodeficiency virus [HIV] disease: Secondary | ICD-10-CM

## 2023-09-02 MED ORDER — BIKTARVY 50-200-25 MG PO TABS
1.0000 | ORAL_TABLET | Freq: Every day | ORAL | 0 refills | Status: DC
Start: 2023-09-02 — End: 2023-10-02
  Filled 2023-09-02 (×2): qty 30, 30d supply, fill #0

## 2023-10-02 ENCOUNTER — Other Ambulatory Visit (HOSPITAL_COMMUNITY): Payer: Self-pay

## 2023-10-02 ENCOUNTER — Other Ambulatory Visit: Payer: Self-pay

## 2023-10-02 ENCOUNTER — Other Ambulatory Visit (HOSPITAL_COMMUNITY): Payer: Self-pay | Admitting: Pharmacy Technician

## 2023-10-02 ENCOUNTER — Other Ambulatory Visit: Payer: Self-pay | Admitting: Internal Medicine

## 2023-10-02 DIAGNOSIS — B20 Human immunodeficiency virus [HIV] disease: Secondary | ICD-10-CM

## 2023-10-02 MED ORDER — BIKTARVY 50-200-25 MG PO TABS
1.0000 | ORAL_TABLET | Freq: Every day | ORAL | 2 refills | Status: DC
Start: 2023-10-02 — End: 2023-12-22
  Filled 2023-10-02 – 2023-10-13 (×2): qty 30, 30d supply, fill #0
  Filled 2023-11-03: qty 30, 30d supply, fill #1
  Filled 2023-12-05: qty 30, 30d supply, fill #2

## 2023-10-02 NOTE — Progress Notes (Signed)
Specialty Pharmacy Refill Coordination Note  Juan Vaughn is a 22 y.o. adult contacted today regarding refills of specialty medication(s) Bictegravir-Emtricitab-Tenofov   Patient requested Delivery   Delivery date: 10/14/23   Verified address: [address] [place]   Medication will be filled on 10/13/23.  Refill Request sent to MD; Call if any delays

## 2023-10-10 ENCOUNTER — Other Ambulatory Visit (HOSPITAL_COMMUNITY): Payer: Self-pay

## 2023-10-13 ENCOUNTER — Other Ambulatory Visit (HOSPITAL_COMMUNITY): Payer: Self-pay

## 2023-10-22 ENCOUNTER — Other Ambulatory Visit: Payer: Self-pay

## 2023-10-29 ENCOUNTER — Other Ambulatory Visit: Payer: Self-pay

## 2023-11-03 ENCOUNTER — Other Ambulatory Visit (HOSPITAL_COMMUNITY): Payer: Self-pay

## 2023-11-03 ENCOUNTER — Other Ambulatory Visit (HOSPITAL_COMMUNITY): Payer: Self-pay | Admitting: Pharmacy Technician

## 2023-11-03 NOTE — Progress Notes (Signed)
Specialty Pharmacy Ongoing Clinical Assessment Note  Ac Wooldridge is a 22 y.o. adult who is being followed by the specialty pharmacy service for RxSp HIV   Patient's specialty medication(s) reviewed today: Bictegravir-Emtricitab-Tenofov   Missed doses in the last 4 weeks: 0   Patient/Caregiver did not have any additional questions or concerns.   Therapeutic benefit summary: Patient is achieving benefit   Adverse events/side effects summary: No adverse events/side effects   Patient's therapy is appropriate to: Continue    Goals Addressed             This Visit's Progress    Achieve Undetectable HIV Viral Load < 20       Patient is not on track and improving. Patient will maintain adherence.  Last viral load was 25 copies/mL, which is down from previous.           Follow up:  6 months  Servando Snare Specialty Pharmacist

## 2023-11-03 NOTE — Progress Notes (Signed)
Specialty Pharmacy Refill Coordination Note  Juan Vaughn is a 22 y.o. adult contacted today regarding refills of specialty medication(s) Bictegravir-Emtricitab-Tenofov   Patient requested Delivery   Delivery date: 11/11/23   Verified address: [address] [place]   Medication will be filled on 11/10/23.

## 2023-11-07 ENCOUNTER — Other Ambulatory Visit: Payer: Self-pay

## 2023-11-07 ENCOUNTER — Other Ambulatory Visit: Payer: Medicaid Other

## 2023-11-07 DIAGNOSIS — B20 Human immunodeficiency virus [HIV] disease: Secondary | ICD-10-CM

## 2023-11-07 DIAGNOSIS — Z79899 Other long term (current) drug therapy: Secondary | ICD-10-CM

## 2023-11-08 LAB — C. TRACHOMATIS/N. GONORRHOEAE RNA
C. trachomatis RNA, TMA: NOT DETECTED
N. gonorrhoeae RNA, TMA: NOT DETECTED

## 2023-11-10 ENCOUNTER — Other Ambulatory Visit: Payer: Self-pay

## 2023-11-10 LAB — CBC WITH DIFFERENTIAL/PLATELET
Absolute Lymphocytes: 2344 {cells}/uL (ref 850–3900)
Absolute Monocytes: 327 {cells}/uL (ref 200–950)
Basophils Absolute: 30 {cells}/uL (ref 0–200)
Basophils Relative: 0.7 %
Eosinophils Absolute: 39 {cells}/uL (ref 15–500)
Eosinophils Relative: 0.9 %
HCT: 49 % (ref 38.5–50.0)
Hemoglobin: 16.1 g/dL (ref 13.2–17.1)
MCH: 31.8 pg (ref 27.0–33.0)
MCHC: 32.9 g/dL (ref 32.0–36.0)
MCV: 96.6 fL (ref 80.0–100.0)
MPV: 9.7 fL (ref 7.5–12.5)
Monocytes Relative: 7.6 %
Neutro Abs: 1561 {cells}/uL (ref 1500–7800)
Neutrophils Relative %: 36.3 %
Platelets: 204 10*3/uL (ref 140–400)
RBC: 5.07 10*6/uL (ref 4.20–5.80)
RDW: 12.1 % (ref 11.0–15.0)
Total Lymphocyte: 54.5 %
WBC: 4.3 10*3/uL (ref 3.8–10.8)

## 2023-11-10 LAB — COMPLETE METABOLIC PANEL WITH GFR
AG Ratio: 2.3 (calc) (ref 1.0–2.5)
ALT: 17 U/L (ref 9–46)
AST: 15 U/L (ref 10–40)
Albumin: 4.9 g/dL (ref 3.6–5.1)
Alkaline phosphatase (APISO): 58 U/L (ref 36–130)
BUN: 13 mg/dL (ref 7–25)
CO2: 28 mmol/L (ref 20–32)
Calcium: 10 mg/dL (ref 8.6–10.3)
Chloride: 101 mmol/L (ref 98–110)
Creat: 0.99 mg/dL (ref 0.60–1.24)
Globulin: 2.1 g/dL (ref 1.9–3.7)
Glucose, Bld: 88 mg/dL (ref 65–99)
Potassium: 4.4 mmol/L (ref 3.5–5.3)
Sodium: 138 mmol/L (ref 135–146)
Total Bilirubin: 0.5 mg/dL (ref 0.2–1.2)
Total Protein: 7 g/dL (ref 6.1–8.1)
eGFR: 110 mL/min/{1.73_m2} (ref >=60)

## 2023-11-10 LAB — RPR: RPR Ser Ql: NONREACTIVE

## 2023-11-10 LAB — HIV-1 RNA QUANT-NO REFLEX-BLD
HIV 1 RNA Quant: <20 {copies}/mL — ABNORMAL HIGH
HIV-1 RNA Quant, Log: <1.3 {Log} — ABNORMAL HIGH

## 2023-11-18 ENCOUNTER — Other Ambulatory Visit: Payer: Self-pay

## 2023-12-01 ENCOUNTER — Other Ambulatory Visit: Payer: Self-pay

## 2023-12-05 ENCOUNTER — Other Ambulatory Visit: Payer: Self-pay

## 2023-12-05 NOTE — Progress Notes (Signed)
Specialty Pharmacy Refill Coordination Note  Juan Vaughn is a 22 y.o. adult contacted today regarding refills of specialty medication(s) Bictegravir-Emtricitab-Tenofov Musician)   Patient requested (Patient-Rptd) Delivery   Delivery date: (Patient-Rptd) 12/09/23   Verified address: (Patient-Rptd) [address], [place] [address]   Medication will be filled on 12.23.24.

## 2023-12-08 ENCOUNTER — Other Ambulatory Visit: Payer: Self-pay

## 2023-12-22 ENCOUNTER — Other Ambulatory Visit: Payer: Self-pay

## 2023-12-22 ENCOUNTER — Encounter: Payer: Self-pay | Admitting: Internal Medicine

## 2023-12-22 ENCOUNTER — Ambulatory Visit (INDEPENDENT_AMBULATORY_CARE_PROVIDER_SITE_OTHER): Payer: Medicaid Other | Admitting: Internal Medicine

## 2023-12-22 VITALS — BP 135/88 | HR 94 | Temp 97.3°F | Wt 125.0 lb

## 2023-12-22 DIAGNOSIS — B20 Human immunodeficiency virus [HIV] disease: Secondary | ICD-10-CM | POA: Diagnosis present

## 2023-12-22 MED ORDER — BIKTARVY 50-200-25 MG PO TABS
1.0000 | ORAL_TABLET | Freq: Every day | ORAL | 5 refills | Status: DC
  Filled 2023-12-22 – 2024-01-08 (×2): qty 30, 30d supply, fill #0
  Filled 2024-02-05: qty 30, 30d supply, fill #1

## 2023-12-22 NOTE — Progress Notes (Signed)
 Patient ID: Juan Vaughn, adult   DOB: 07-03-2001, 23 y.o.   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 Undetectable in nov 2024. Here with his partner  Thinking about relocating to [place]  in April.[place], [place] . 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   Outpatient Encounter Medications as of 12/22/2023  Medication Sig   bictegravir-emtricitabine -tenofovir  AF (BIKTARVY ) 50-200-25 MG TABS tablet Take 1 tablet by mouth daily. Please call [phone] to schedule appt   No facility-administered encounter medications on file as of 12/22/2023.     Patient Active Problem List   Diagnosis Date Noted   Protein-calorie malnutrition, severe 06/19/2020   Pneumonia 06/18/2020   Acute HIV infection (HCC) 06/18/2020   Aseptic meningitis 06/18/2020     Health Maintenance Due  Topic Date Due   COVID-19 Vaccine (1) Never done   INFLUENZA VACCINE  07/17/2023     Review of Systems Review of Systems  Constitutional: Negative for fever, chills, diaphoresis, activity change, appetite change, fatigue and unexpected weight change.  HENT: Negative for congestion, sore throat, rhinorrhea, sneezing, trouble swallowing and sinus pressure.  Eyes: Negative for photophobia and visual disturbance.  Respiratory: Negative for cough, chest tightness, shortness of breath, wheezing and stridor.  Cardiovascular: Negative for chest pain, palpitations and leg swelling.  Gastrointestinal: Negative for nausea, vomiting, abdominal pain, diarrhea, constipation, blood in stool, abdominal distention and anal bleeding.  Genitourinary: Negative for dysuria, hematuria, flank pain and difficulty urinating.  Musculoskeletal: Negative for myalgias, back pain, joint swelling, arthralgias and gait problem.  Skin: Negative for color change, pallor, rash and wound.  Neurological: Negative for dizziness, tremors, weakness and light-headedness.   Hematological: Negative for adenopathy. Does not bruise/bleed easily.  Psychiatric/Behavioral: Negative for behavioral problems, confusion, sleep disturbance, dysphoric mood, decreased concentration and agitation.   Physical Exam  BP 135/88   Pulse 94   Temp (!) 97.3 F (36.3 C) (Oral)   Wt 125 lb (56.7 kg)   SpO2 100%   BMI 20.18 kg/m  Physical Exam  Constitutional: He is oriented to person, place, and time. He appears well-developed and well-nourished. No distress.  HENT:  Mouth/Throat: Oropharynx is clear and moist. No oropharyngeal exudate.  Cardiovascular: Normal rate, regular rhythm and normal heart sounds. Exam reveals no gallop and no friction rub.  No murmur heard.  Pulmonary/Chest: Effort normal and breath sounds normal. No respiratory distress. He has no wheezes.  Abdominal: Soft. Bowel sounds are normal. He exhibits no distension. There is no tenderness.  Lymphadenopathy:  He has no cervical adenopathy.  Neurological: He is alert and oriented to person, place, and time.  Skin: Skin is warm and dry. No rash noted. No erythema.  Psychiatric: He has a normal mood and affect. His behavior is normal.    Lab Results  Component Value Date   CD4TCELL 42 04/15/2023   Lab Results  Component Value Date   CD4TABS 1,076 04/15/2023   CD4TABS 898 08/15/2022   CD4TABS 1,011 07/16/2021   Lab Results  Component Value Date   HIV1RNAQUANT <20 (H) 11/07/2023   No results found for: HEPBSAB Lab Results  Component Value Date   LABRPR NON-REACTIVE 11/07/2023    CBC Lab Results  Component Value Date   WBC 4.3 11/07/2023   RBC 5.07 11/07/2023   HGB 16.1 11/07/2023   HCT 49.0 11/07/2023   PLT 204 11/07/2023   MCV 96.6 11/07/2023   MCH 31.8 11/07/2023   MCHC  32.9 11/07/2023   RDW 12.1 11/07/2023   LYMPHSABS 2,841 04/15/2023   MONOABS 0.2 06/21/2020   EOSABS 39 11/07/2023    BMET Lab Results  Component Value Date   NA 138 11/07/2023   K 4.4 11/07/2023   CL 101  11/07/2023   CO2 28 11/07/2023   GLUCOSE 88 11/07/2023   BUN 13 11/07/2023   CREATININE 0.99 11/07/2023   CALCIUM 10.0 11/07/2023   GFRNONAA 124 01/15/2021   GFRAA 143 01/15/2021      Assessment and Plan  Hiv disease = refill biktarvy . See back in 2 months with labs. Reviewed labs and he remains undetectable  Long term medicaiton management = cr is stable  Deferred vaccine for this visit  I have personally spent 35 minutes involved in face-to-face and non-face-to-face activities for this patient on the day of the visit. Professional time spent includes the following activities: Preparing to see the patient (review of tests), Obtaining and/or reviewing separately obtained history (admission/discharge record), Performing a medically appropriate examination and/or evaluation , Ordering medications/tests/procedures, referring and communicating with other health care professionals, Documenting clinical information in the EMR, Independently interpreting results (not separately reported), Communicating results to the patient/family/caregiver, Counseling and educating the patient/family/caregiver and Care coordination (not separately reported).

## 2024-01-07 ENCOUNTER — Other Ambulatory Visit: Payer: Self-pay

## 2024-01-08 ENCOUNTER — Other Ambulatory Visit: Payer: Self-pay

## 2024-01-08 NOTE — Progress Notes (Signed)
Specialty Pharmacy Refill Coordination Note  Juan Vaughn is a 23 y.o. adult contacted today regarding refills of specialty medication(s) Bictegravir-Emtricitab-Tenofov [name]   Patient requested Delivery   Delivery date: 01/15/24   Verified address: [address] [place] [address]   Medication will be filled on 01/14/24.

## 2024-01-14 ENCOUNTER — Other Ambulatory Visit: Payer: Self-pay

## 2024-02-05 ENCOUNTER — Other Ambulatory Visit: Payer: Self-pay

## 2024-02-05 NOTE — Progress Notes (Signed)
Specialty Pharmacy Refill Coordination Note  Juan Vaughn is a 23 y.o. adult contacted today regarding refills of specialty medication(s) Bictegravir-Emtricitab-Tenofov [name]   Patient requested Delivery   Delivery date: 02/11/24   Verified address: [address], [place] [address]   Medication will be filled on 02.25.25.

## 2024-02-11 ENCOUNTER — Other Ambulatory Visit: Payer: Medicaid Other

## 2024-02-13 ENCOUNTER — Other Ambulatory Visit: Payer: Self-pay | Admitting: Internal Medicine

## 2024-02-13 ENCOUNTER — Other Ambulatory Visit: Payer: Self-pay

## 2024-02-13 ENCOUNTER — Other Ambulatory Visit: Payer: Medicaid Other

## 2024-02-13 DIAGNOSIS — B20 Human immunodeficiency virus [HIV] disease: Secondary | ICD-10-CM

## 2024-02-16 LAB — LIPID PANEL
Cholesterol: 181 mg/dL (ref <200)
HDL: 65 mg/dL (ref >=40)
LDL Cholesterol (Calc): 101 mg/dL — ABNORMAL HIGH
Non-HDL Cholesterol (Calc): 116 mg/dL (ref <130)
Total CHOL/HDL Ratio: 2.8 (calc) (ref <5.0)
Triglycerides: 61 mg/dL (ref <150)

## 2024-02-16 LAB — BASIC METABOLIC PANEL
BUN: 14 mg/dL (ref 7–25)
CO2: 30 mmol/L (ref 20–32)
Calcium: 9.7 mg/dL (ref 8.6–10.3)
Chloride: 102 mmol/L (ref 98–110)
Creat: 0.92 mg/dL (ref 0.60–1.24)
Glucose, Bld: 82 mg/dL (ref 65–99)
Potassium: 4 mmol/L (ref 3.5–5.3)
Sodium: 138 mmol/L (ref 135–146)

## 2024-02-16 LAB — CBC WITH DIFFERENTIAL/PLATELET
Absolute Lymphocytes: 2455 {cells}/uL (ref 850–3900)
Absolute Monocytes: 476 {cells}/uL (ref 200–950)
Basophils Absolute: 27 {cells}/uL (ref 0–200)
Basophils Relative: 0.4 %
Eosinophils Absolute: 61 {cells}/uL (ref 15–500)
Eosinophils Relative: 0.9 %
HCT: 47.5 % (ref 38.5–50.0)
Hemoglobin: 15.8 g/dL (ref 13.2–17.1)
MCH: 31.7 pg (ref 27.0–33.0)
MCHC: 33.3 g/dL (ref 32.0–36.0)
MCV: 95.2 fL (ref 80.0–100.0)
MPV: 9.3 fL (ref 7.5–12.5)
Monocytes Relative: 7 %
Neutro Abs: 3781 {cells}/uL (ref 1500–7800)
Neutrophils Relative %: 55.6 %
Platelets: 202 10*3/uL (ref 140–400)
RBC: 4.99 10*6/uL (ref 4.20–5.80)
RDW: 11.7 % (ref 11.0–15.0)
Total Lymphocyte: 36.1 %
WBC: 6.8 10*3/uL (ref 3.8–10.8)

## 2024-02-16 LAB — T-HELPER CELLS (CD4) COUNT (NOT AT ARMC)
Absolute CD4: 911 {cells}/uL (ref 490–1740)
CD4 T Helper %: 40 % (ref 30–61)
Total lymphocyte count: 2276 {cells}/uL (ref 850–3900)

## 2024-02-16 LAB — HIV-1 RNA QUANT-NO REFLEX-BLD
HIV 1 RNA Quant: <20 {copies}/mL — ABNORMAL HIGH
HIV-1 RNA Quant, Log: <1.3 {Log_copies}/mL — ABNORMAL HIGH

## 2024-02-16 LAB — RPR: RPR Ser Ql: NONREACTIVE

## 2024-02-18 ENCOUNTER — Other Ambulatory Visit: Payer: Self-pay

## 2024-02-18 ENCOUNTER — Telehealth (INDEPENDENT_AMBULATORY_CARE_PROVIDER_SITE_OTHER): Payer: Medicaid Other | Admitting: Internal Medicine

## 2024-02-18 DIAGNOSIS — B20 Human immunodeficiency virus [HIV] disease: Secondary | ICD-10-CM | POA: Diagnosis present

## 2024-02-18 MED ORDER — BIKTARVY 50-200-25 MG PO TABS
1.0000 | ORAL_TABLET | Freq: Every day | ORAL | 5 refills | Status: DC
  Filled 2024-02-18 – 2024-03-02 (×2): qty 30, 30d supply, fill #0
  Filled 2024-03-31 (×2): qty 30, 30d supply, fill #1
  Filled 2024-04-29: qty 30, 30d supply, fill #2
  Filled 2024-05-31 (×2): qty 30, 30d supply, fill #3
  Filled 2024-06-28: qty 30, 30d supply, fill #4
  Filled 2024-07-26: qty 30, 30d supply, fill #5

## 2024-02-18 NOTE — Progress Notes (Signed)
 Virtual Visit via Video Note  I connected with Juan Vaughn on 02/18/24 at 11:00 AM EST by a video enabled telemedicine application and verified that I am speaking with the correct person using two identifiers.  Location: Patient: in car Provider: at clinic   I discussed the limitations of evaluation and management by telemedicine and the availability of in person appointments. The patient expressed understanding and agreed to proceed.  History of Present Illness:  Had a case of norovirus (nausea and vomiting) in late January - was feeling better after a week. Lost a few pounds from illness, and now improving back to normal health.   planning to move in late Fall to [place], [place] Observations/Objective:  Gen = a x o by 3 in nad  Assessment and Plan: HIV disease = will plan to check labs ; provide Refill on biktarvy  Long term medication management = will check cr  Hx of norovirus = appears recovered from acute illness  Will see back in 3 months for follow up visit  Follow Up Instructions:    I discussed the assessment and treatment plan with the patient. The patient was provided an opportunity to ask questions and all were answered. The patient agreed with the plan and demonstrated an understanding of the instructions.   The patient was advised to call back or seek an in-person evaluation if the symptoms worsen or if the condition fails to improve as anticipated.  I provided 20 minutes of non-face-to-face time during this encounter.   Judyann Munson, MD

## 2024-03-02 ENCOUNTER — Other Ambulatory Visit: Payer: Self-pay

## 2024-03-02 ENCOUNTER — Other Ambulatory Visit (HOSPITAL_COMMUNITY): Payer: Self-pay

## 2024-03-02 NOTE — Progress Notes (Signed)
 Specialty Pharmacy Refill Coordination Note  Lisle Skillman is a 23 y.o. adult contacted today regarding refills of specialty medication(s) Bictegravir-Emtricitab-Tenofov [facility])   Patient requested (Patient-Rptd) Delivery   Delivery date: (Patient-Rptd) 03/08/24   Verified address: (Patient-Rptd) [address] [place] [address]   Medication will be filled on 03.21.25.

## 2024-03-05 ENCOUNTER — Other Ambulatory Visit: Payer: Self-pay

## 2024-03-30 ENCOUNTER — Other Ambulatory Visit: Payer: Self-pay

## 2024-03-31 ENCOUNTER — Other Ambulatory Visit (HOSPITAL_COMMUNITY): Payer: Self-pay

## 2024-03-31 ENCOUNTER — Other Ambulatory Visit: Payer: Self-pay

## 2024-03-31 NOTE — Progress Notes (Signed)
 Specialty Pharmacy Refill Coordination Note  Juan Vaughn is a 23 y.o. adult contacted today regarding refills of specialty medication(s) Biktarvy.  Patient requested (Patient-Rptd) Delivery   Delivery date: (Patient-Rptd) 04/05/24   Verified address: (Patient-Rptd) [address], [place], [address]   Medication will be filled on 04/05/24. New delivery date is 04/06/24. Patient has been notified.    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 Called patient to let him know that we can only mail to [facility]. Patient gave temporary address for this delivery. [address] [place] [address].

## 2024-04-05 ENCOUNTER — Other Ambulatory Visit: Payer: Self-pay

## 2024-04-29 ENCOUNTER — Other Ambulatory Visit (HOSPITAL_COMMUNITY): Payer: Self-pay

## 2024-04-29 NOTE — Progress Notes (Signed)
 Specialty Pharmacy Ongoing Clinical Assessment Note  Juan Vaughn is a 23 y.o. adult who is being followed by the specialty pharmacy service for RxSp HIV   Patient's specialty medication(s) reviewed today: Bictegravir-Emtricitab-Tenofov (Biktarvy )   Missed doses in the last 4 weeks: 0   Patient/Caregiver did not have any additional questions or concerns.   Therapeutic benefit summary: Patient is achieving benefit   Adverse events/side effects summary: No adverse events/side effects   Patient's therapy is appropriate to: Continue    Goals Addressed             This Visit's Progress    Achieve Undetectable HIV Viral Load < 20   On track    Patient is not on track and improving. Patient will maintain adherence.  Last viral load was undetectable on 02/13/24.         Follow up: 6 months  Malachi Screws Specialty Pharmacist

## 2024-04-29 NOTE — Progress Notes (Signed)
 Specialty Pharmacy Refill Coordination Note  Juan Vaughn is a 24 y.o. adult contacted today regarding refills of specialty medication(s) Bictegravir-Emtricitab-Tenofov (Biktarvy )   Patient requested [facility] date: 05/07/24   Medication will be filled on 02/07/24.

## 2024-05-31 ENCOUNTER — Other Ambulatory Visit (HOSPITAL_COMMUNITY): Payer: Self-pay

## 2024-05-31 ENCOUNTER — Other Ambulatory Visit: Payer: Self-pay

## 2024-05-31 NOTE — Progress Notes (Signed)
 Specialty Pharmacy Refill Coordination Note  Spoke with Schnapp, Abelardo (Self).   Tige Troiani is a 23 y.o. adult contacted today regarding refills of specialty medication(s) Bictegravir-Emtricitab-Tenofov (Biktarvy )  Patient requested Delivery   Delivery date: 06/02/24   Verified address: [address] [place],  [place] [address]  Medication will be filled on 06/01/24.

## 2024-06-28 ENCOUNTER — Other Ambulatory Visit (HOSPITAL_COMMUNITY): Payer: Self-pay

## 2024-06-28 ENCOUNTER — Other Ambulatory Visit: Payer: Self-pay

## 2024-06-28 ENCOUNTER — Encounter (INDEPENDENT_AMBULATORY_CARE_PROVIDER_SITE_OTHER): Payer: Self-pay

## 2024-06-28 NOTE — Progress Notes (Signed)
 Specialty Pharmacy Refill Coordination Note  Juan Vaughn is a 23 y.o. adult contacted today regarding refills of specialty medication(s) Bictegravir-Emtricitab-Tenofov (Biktarvy )   Patient requested (Patient-Rptd) Delivery   Delivery date: 06/30/24   Verified address: (Patient-Rptd) [address] [place], [place] [address]   Medication will be filled on 06/29/24.

## 2024-07-05 ENCOUNTER — Other Ambulatory Visit: Payer: Self-pay

## 2024-07-05 ENCOUNTER — Other Ambulatory Visit

## 2024-07-05 DIAGNOSIS — B20 Human immunodeficiency virus [HIV] disease: Secondary | ICD-10-CM

## 2024-07-05 DIAGNOSIS — Z113 Encounter for screening for infections with a predominantly sexual mode of transmission: Secondary | ICD-10-CM

## 2024-07-06 LAB — URINE CYTOLOGY ANCILLARY ONLY
Chlamydia: NEGATIVE
Comment: NEGATIVE
Comment: NORMAL
Neisseria Gonorrhea: NEGATIVE

## 2024-07-06 LAB — T-HELPER CELL (CD4) - (RCID CLINIC ONLY)
CD4 % Helper T Cell: 43 % (ref 33–65)
CD4 T Cell Abs: 1181 /uL (ref 400–1790)

## 2024-07-07 LAB — COMPLETE METABOLIC PANEL WITHOUT GFR
AG Ratio: 2 (calc) (ref 1.0–2.5)
ALT: 18 U/L (ref 9–46)
AST: 18 U/L (ref 10–40)
Albumin: 4.6 g/dL (ref 3.6–5.1)
Alkaline phosphatase (APISO): 50 U/L (ref 36–130)
BUN: 11 mg/dL (ref 7–25)
CO2: 28 mmol/L (ref 20–32)
Calcium: 10 mg/dL (ref 8.6–10.3)
Chloride: 102 mmol/L (ref 98–110)
Creat: 0.95 mg/dL (ref 0.60–1.24)
Globulin: 2.3 g/dL (ref 1.9–3.7)
Glucose, Bld: 66 mg/dL (ref 65–99)
Potassium: 4.2 mmol/L (ref 3.5–5.3)
Sodium: 140 mmol/L (ref 135–146)
Total Bilirubin: 0.6 mg/dL (ref 0.2–1.2)
Total Protein: 6.9 g/dL (ref 6.1–8.1)

## 2024-07-07 LAB — CBC WITH DIFFERENTIAL/PLATELET
Absolute Lymphocytes: 3000 {cells}/uL (ref 850–3900)
Absolute Monocytes: 432 {cells}/uL (ref 200–950)
Basophils Absolute: 31 {cells}/uL (ref 0–200)
Basophils Relative: 0.6 %
Eosinophils Absolute: 182 {cells}/uL (ref 15–500)
Eosinophils Relative: 3.5 %
HCT: 47.8 % (ref 38.5–50.0)
Hemoglobin: 16 g/dL (ref 13.2–17.1)
MCH: 32.3 pg (ref 27.0–33.0)
MCHC: 33.5 g/dL (ref 32.0–36.0)
MCV: 96.6 fL (ref 80.0–100.0)
MPV: 9.8 fL (ref 7.5–12.5)
Monocytes Relative: 8.3 %
Neutro Abs: 1555 {cells}/uL (ref 1500–7800)
Neutrophils Relative %: 29.9 %
Platelets: 191 Thousand/uL (ref 140–400)
RBC: 4.95 Million/uL (ref 4.20–5.80)
RDW: 12.2 % (ref 11.0–15.0)
Total Lymphocyte: 57.7 %
WBC: 5.2 Thousand/uL (ref 3.8–10.8)

## 2024-07-07 LAB — HIV-1 RNA QUANT-NO REFLEX-BLD
HIV 1 RNA Quant: NOT DETECTED {copies}/mL
HIV-1 RNA Quant, Log: NOT DETECTED {Log_copies}/mL

## 2024-07-24 ENCOUNTER — Encounter (INDEPENDENT_AMBULATORY_CARE_PROVIDER_SITE_OTHER): Payer: Self-pay

## 2024-07-26 ENCOUNTER — Other Ambulatory Visit: Payer: Self-pay | Admitting: Pharmacy Technician

## 2024-07-26 ENCOUNTER — Other Ambulatory Visit: Payer: Self-pay

## 2024-07-26 NOTE — Progress Notes (Signed)
 Specialty Pharmacy Refill Coordination Note  Juan Vaughn is a 23 y.o. adult contacted today regarding refills of specialty medication(s) Bictegravir-Emtricitab-Tenofov (Biktarvy )   Patient requested Delivery   Delivery date: 08/02/24   Verified address: [address] [place] [address]   Medication will be filled on 07/30/24.

## 2024-07-30 ENCOUNTER — Other Ambulatory Visit: Payer: Self-pay

## 2024-08-20 ENCOUNTER — Other Ambulatory Visit (HOSPITAL_COMMUNITY): Payer: Self-pay

## 2024-08-20 ENCOUNTER — Other Ambulatory Visit: Payer: Self-pay | Admitting: Internal Medicine

## 2024-08-20 ENCOUNTER — Encounter (INDEPENDENT_AMBULATORY_CARE_PROVIDER_SITE_OTHER): Payer: Self-pay

## 2024-08-20 ENCOUNTER — Other Ambulatory Visit: Payer: Self-pay

## 2024-08-20 ENCOUNTER — Other Ambulatory Visit: Payer: Self-pay | Admitting: Pharmacy Technician

## 2024-08-20 DIAGNOSIS — B20 Human immunodeficiency virus [HIV] disease: Secondary | ICD-10-CM

## 2024-08-20 NOTE — Progress Notes (Signed)
 Specialty Pharmacy Refill Coordination Note  Gurley Climer is a 23 y.o. adult contacted today regarding refills of specialty medication(s) Bictegravir-Emtricitab-Tenofov (Biktarvy )   Patient requested (Patient-Rptd) Delivery   Delivery date: 08/31/24  Verified address: (Patient-Rptd) [address] [place], [place] [address]   Medication will be filled on 08/30/24.  RR sent to MD

## 2024-08-23 ENCOUNTER — Other Ambulatory Visit: Payer: Self-pay

## 2024-08-23 MED ORDER — BIKTARVY 50-200-25 MG PO TABS
1.0000 | ORAL_TABLET | Freq: Every day | ORAL | 0 refills | Status: DC
  Filled 2024-08-23: qty 30, 30d supply, fill #0

## 2024-08-30 ENCOUNTER — Other Ambulatory Visit: Payer: Self-pay

## 2024-09-08 ENCOUNTER — Encounter: Payer: Self-pay | Admitting: Internal Medicine

## 2024-09-08 ENCOUNTER — Telehealth (INDEPENDENT_AMBULATORY_CARE_PROVIDER_SITE_OTHER): Admitting: Internal Medicine

## 2024-09-08 ENCOUNTER — Other Ambulatory Visit: Payer: Self-pay

## 2024-09-08 DIAGNOSIS — Z79899 Other long term (current) drug therapy: Secondary | ICD-10-CM

## 2024-09-08 DIAGNOSIS — Z113 Encounter for screening for infections with a predominantly sexual mode of transmission: Secondary | ICD-10-CM

## 2024-09-08 DIAGNOSIS — B20 Human immunodeficiency virus [HIV] disease: Secondary | ICD-10-CM | POA: Diagnosis present

## 2024-09-08 MED ORDER — BIKTARVY 50-200-25 MG PO TABS
1.0000 | ORAL_TABLET | Freq: Every day | ORAL | 11 refills | Status: AC
Start: 2024-09-08 — End: ?
  Filled 2024-09-08 – 2024-09-23 (×2): qty 30, 30d supply, fill #0
  Filled 2024-10-19: qty 30, 30d supply, fill #1
  Filled 2024-11-12: qty 30, 30d supply, fill #2

## 2024-09-08 NOTE — Progress Notes (Unsigned)
 Virtual Visit via Video Note  I connected with Urian Flenniken on 09/08/24 at 10:45 AM EDT by a video enabled telemedicine application and verified that I am speaking with the correct person using two identifiers.  Location: Patient: at home Provider: in clinic   I discussed the limitations of evaluation and management by telemedicine and the availability of in person appointments. The patient expressed understanding and agreed to proceed.  History of Present Illness: He reports doing well not missing dosese   Observations/Objective: Gen = a xo by 3 in nad HEENT= PERRLA EOMI moist mucos membranes No acc muscle use. No coughing No visible rash  Assessment and Plan: HIV disease= continues to be well controlled. Will give refills on biktarvy   Long term medication management = cr is stable  Health maintenance= recommend flu and covid vaccine  No need for hpv already immunized  See back in 6 month   Follow Up Instructions:    I discussed the assessment and treatment plan with the patient. The patient was provided an opportunity to ask questions and all were answered. The patient agreed with the plan and demonstrated an understanding of the instructions.   The patient was advised to call back or seek an in-person evaluation if the symptoms worsen or if the condition fails to improve as anticipated.     Montie Bologna, MD

## 2024-09-22 ENCOUNTER — Encounter (INDEPENDENT_AMBULATORY_CARE_PROVIDER_SITE_OTHER): Payer: Self-pay

## 2024-09-23 ENCOUNTER — Other Ambulatory Visit: Payer: Self-pay

## 2024-09-23 ENCOUNTER — Other Ambulatory Visit (HOSPITAL_COMMUNITY): Payer: Self-pay

## 2024-09-23 NOTE — Progress Notes (Signed)
 Specialty Pharmacy Refill Coordination Note  MyChart Questionnaire Submission  Juan Vaughn is a 23 y.o. adult contacted today regarding refills of specialty medication(s) Biktarvy .  Doses on hand: (Patient-Rptd) 12   Patient requested: (Patient-Rptd) Delivery   Delivery date: 09/27/24  Verified address: [address] [place] [address]  Medication will be filled on 09/24/24.

## 2024-10-19 ENCOUNTER — Other Ambulatory Visit: Payer: Self-pay

## 2024-10-19 ENCOUNTER — Other Ambulatory Visit: Payer: Self-pay | Admitting: Pharmacy Technician

## 2024-10-19 ENCOUNTER — Other Ambulatory Visit (HOSPITAL_COMMUNITY): Payer: Self-pay

## 2024-10-19 ENCOUNTER — Encounter (INDEPENDENT_AMBULATORY_CARE_PROVIDER_SITE_OTHER): Payer: Self-pay

## 2024-10-19 NOTE — Progress Notes (Signed)
 Specialty Pharmacy Refill Coordination Note  Juan Vaughn is a 23 y.o. adult contacted today regarding refills of specialty medication(s) Bictegravir-Emtricitab-Tenofov (Biktarvy )   Patient requested (Patient-Rptd) Delivery   Delivery date: 10/22/2024 Verified address: (Patient-Rptd) [address] [place], [place] [address]   Medication will be filled on: 10/21/2024

## 2024-10-20 ENCOUNTER — Other Ambulatory Visit: Payer: Self-pay

## 2024-10-21 ENCOUNTER — Other Ambulatory Visit: Payer: Self-pay

## 2024-11-12 ENCOUNTER — Other Ambulatory Visit: Payer: Self-pay

## 2024-11-12 ENCOUNTER — Other Ambulatory Visit (HOSPITAL_COMMUNITY): Payer: Self-pay

## 2024-11-12 NOTE — Progress Notes (Signed)
 Specialty Pharmacy Refill Coordination Note  Juan Vaughn is a 23 y.o. adult contacted today regarding refills of specialty medication(s) Bictegravir-Emtricitab-Tenofov (Biktarvy )   Patient requested Delivery   Delivery date: 11/19/24   Verified address: [address] [place] [address]   Medication will be filled on: 11/18/24

## 2024-11-18 ENCOUNTER — Other Ambulatory Visit: Payer: Self-pay

## 2024-11-18 ENCOUNTER — Other Ambulatory Visit (HOSPITAL_COMMUNITY): Payer: Self-pay

## 2024-11-18 NOTE — Progress Notes (Signed)
 Benefits Investigation Started  Reason: Filled After Coverage Terminated  Routed to: Donna/Courtney

## 2024-11-19 ENCOUNTER — Other Ambulatory Visit (HOSPITAL_COMMUNITY): Payer: Self-pay

## 2024-11-19 ENCOUNTER — Other Ambulatory Visit: Payer: Self-pay

## 2024-11-22 ENCOUNTER — Other Ambulatory Visit: Payer: Self-pay

## 2024-11-23 ENCOUNTER — Other Ambulatory Visit: Payer: Self-pay

## 2024-11-24 ENCOUNTER — Other Ambulatory Visit: Payer: Self-pay

## 2024-11-25 ENCOUNTER — Other Ambulatory Visit: Payer: Self-pay

## 2024-11-25 ENCOUNTER — Other Ambulatory Visit (HOSPITAL_COMMUNITY): Payer: Self-pay

## 2024-11-25 NOTE — Progress Notes (Signed)
 Patient confirmed that he is filling at [facility] specialty pharmacy . Dis enrolling due to this matter.

## 2025-03-14 ENCOUNTER — Other Ambulatory Visit

## 2025-03-28 ENCOUNTER — Ambulatory Visit: Admitting: Internal Medicine
